# Patient Record
Sex: Male | Born: 1940 | Race: White | Marital: Married | Smoking: Former smoker
Health system: Southern US, Community
[De-identification: ages and names within clinical notes are randomized; demographics above are authoritative.]

## PROBLEM LIST (undated history)

## (undated) DIAGNOSIS — H539 Unspecified visual disturbance: Secondary | ICD-10-CM

## (undated) DIAGNOSIS — E119 Type 2 diabetes mellitus without complications: Secondary | ICD-10-CM

## (undated) DIAGNOSIS — I2510 Atherosclerotic heart disease of native coronary artery without angina pectoris: Secondary | ICD-10-CM

## (undated) DIAGNOSIS — I10 Essential (primary) hypertension: Secondary | ICD-10-CM

## (undated) HISTORY — PX: KNEE ARTHROSCOPY: SUR90

## (undated) HISTORY — DX: Unspecified visual disturbance: H53.9

## (undated) HISTORY — PX: CORONARY ANGIOPLASTY WITH STENT PLACEMENT: SHX49

## (undated) HISTORY — DX: Type 2 diabetes mellitus without complications: E11.9

## (undated) HISTORY — PX: ROTATOR CUFF REPAIR: SHX139

## (undated) HISTORY — DX: Atherosclerotic heart disease of native coronary artery without angina pectoris: I25.10

## (undated) HISTORY — DX: Essential (primary) hypertension: I10

---

## 2011-01-04 ENCOUNTER — Other Ambulatory Visit: Payer: Self-pay | Admitting: Neurosurgery

## 2011-01-04 DIAGNOSIS — M549 Dorsalgia, unspecified: Secondary | ICD-10-CM

## 2011-01-09 ENCOUNTER — Ambulatory Visit
Admission: RE | Admit: 2011-01-09 | Discharge: 2011-01-09 | Disposition: A | Discharge status: Home / Self Care | Payer: Medicare Other | Source: Ambulatory Visit | Attending: Neurosurgery | Admitting: Neurosurgery

## 2011-01-09 ENCOUNTER — Ambulatory Visit
Admission: RE | Admit: 2011-01-09 | Discharge: 2011-01-09 | Disposition: A | Discharge status: Home / Self Care | Payer: Self-pay | Source: Ambulatory Visit | Attending: Neurosurgery | Admitting: Neurosurgery

## 2011-01-09 DIAGNOSIS — M549 Dorsalgia, unspecified: Secondary | ICD-10-CM

## 2014-04-20 ENCOUNTER — Other Ambulatory Visit: Payer: Self-pay | Admitting: Occupational Medicine

## 2014-04-20 ENCOUNTER — Ambulatory Visit: Payer: Self-pay

## 2014-04-20 DIAGNOSIS — R52 Pain, unspecified: Secondary | ICD-10-CM

## 2015-02-03 ENCOUNTER — Ambulatory Visit (INDEPENDENT_AMBULATORY_CARE_PROVIDER_SITE_OTHER): Payer: Medicare PPO | Admitting: Neurology

## 2015-02-03 ENCOUNTER — Encounter: Payer: Self-pay | Admitting: Neurology

## 2015-02-03 VITALS — BP 146/92 | HR 66 | Resp 18 | Ht 78.0 in | Wt 280.6 lb

## 2015-02-03 DIAGNOSIS — M545 Low back pain: Secondary | ICD-10-CM

## 2015-02-03 DIAGNOSIS — M47816 Spondylosis without myelopathy or radiculopathy, lumbar region: Secondary | ICD-10-CM

## 2015-02-03 DIAGNOSIS — M5432 Sciatica, left side: Secondary | ICD-10-CM | POA: Diagnosis not present

## 2015-02-03 DIAGNOSIS — K5900 Constipation, unspecified: Secondary | ICD-10-CM | POA: Insufficient documentation

## 2015-02-03 DIAGNOSIS — F1190 Opioid use, unspecified, uncomplicated: Secondary | ICD-10-CM | POA: Insufficient documentation

## 2015-02-03 DIAGNOSIS — G4700 Insomnia, unspecified: Secondary | ICD-10-CM | POA: Diagnosis not present

## 2015-02-03 DIAGNOSIS — K5909 Other constipation: Secondary | ICD-10-CM | POA: Diagnosis not present

## 2015-02-03 MED ORDER — MORPHINE SULFATE 15 MG PO TABS
15.0000 mg | ORAL_TABLET | ORAL | Status: DC | PRN
Start: 2015-02-03 — End: 2015-02-03

## 2015-02-03 MED ORDER — CYCLOBENZAPRINE HCL 5 MG PO TABS: 5.0000 mg | ORAL_TABLET | Freq: Every day | ORAL | Status: DC

## 2015-02-03 MED ORDER — MORPHINE SULFATE 15 MG PO TABS: 15.0000 mg | ORAL_TABLET | Freq: Two times a day (BID) | ORAL | Status: DC | PRN

## 2015-02-03 MED ORDER — MORPHINE SULFATE ER 30 MG PO TBCR: 30.0000 mg | EXTENDED_RELEASE_TABLET | Freq: Two times a day (BID) | ORAL | Status: DC

## 2015-02-03 NOTE — Progress Notes (Signed)
[facility]  PATIENT: Dale FisherRonald Parks DOB: 10/19/1940 2REFERRING DOCTOR OR PCP:  Josiah LoboJohn McFadden SOURCE: Patient and records from [facility]  _________________________________   HISTORICAL  CHIEF COMPLAINT:  Chief Complaint  Patient presents with  . Back Pain    Sts. sudden onset of nonradiating left sided lbp 2 weeks ago when he bent down to pick up a newspaper.Chucky May./fim    HISTORY OF PRESENT ILLNESS:  Dale Parks is a 74 year old man who [facility]. His main issues are related to his low back pain.     Over the years, due to his pain and him not being a surgical candidate, as been helped by opiate therapy. He got the best benefit from morphine, but had wanted to constipation. Therefore, he switched first 2 of the [place] with less benefit and then to Nucynta. The Nucynta was much better tolerated but has not helped his pain as much.  About 2 weeks ago, he leaned over and began to experience pain in the left back the pain is not going down the leg but does radiate into the buttocks. There is no numbness or weakness associated with the new pain. There has not been any further change in bladder or bowel because of this pain. Unfortunately, the Nucynta has not controlled her pain well enough.  She also has been sleeping poorly and that has been worse since the pain intensified. He has more trouble with sleep maintenance than with sleep onset. In the past, nighttime Flexeril was beneficial.  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 There is facet joint hypertrophy and most of these levels and mild disc protrusions, as well. There is no definite nerve root compression. He underwent 2 medial branch blocks and radiofrequency ablation of the left L2, L3, L4 and L5 medial branch (dorsal ramus) nerves he had a good result with the medial branch blocks  and good results for several weeks with the radiofrequency ablation but then pain returned to the same level as it had been in the past.  REVIEW OF SYSTEMS: Constitutional: No fevers, chills, sweats, or change in appetite Eyes: No visual changes, double vision, eye pain Ear, nose and throat: No hearing loss, ear pain, nasal congestion, sore throat Cardiovascular: No chest pain, palpitations Respiratory: No shortness of breath at rest or with exertion.   No wheezes.  Has snoring GastrointestinaI: No nausea, vomiting, diarrhea, abdominal pain, fecal incontinence Genitourinary: No dysuria, urinary retention or frequency.  No nocturia. Musculoskeletal: Notes right shoulder and lower back pain Integumentary: No rash, pruritus, skin lesions Neurological: as above Psychiatric: No depression at this time.  No anxiety Endocrine: No palpitations, diaphoresis, change in appetite, change in weigh or increased thirst Hematologic/Lymphatic: He Bruises easily.   No anemia, purpura, petechiae. Allergic/Immunologic: No itchy/runny eyes, nasal congestion, recent allergic reactions, rashes  ALLERGIES: Allergies  Allergen Reactions  . Codeine Nausea Only  . Sulfa Antibiotics Rash  . Warfarin Sodium Rash    HOME MEDICATIONS:  Current outpatient prescriptions:  .  allopurinol (ZYLOPRIM) 100 MG tablet, Take 100 mg by mouth daily., Disp: , Rfl:  .  amLODipine (NORVASC) 5 MG tablet, Take 5 mg by mouth daily., Disp: , Rfl:  .  aspirin 81 MG tablet, Take 81 mg by mouth daily., Disp: , Rfl:  .  atorvastatin (LIPITOR) 80 MG tablet, Take 80 mg by mouth daily., Disp: , Rfl:  .  clopidogrel (PLAVIX) 75 MG tablet, Take 75 mg by mouth daily., Disp: , Rfl:  .  DULoxetine (CYMBALTA) 60 MG capsule, Take 60 mg by mouth 2 (two) times daily., Disp: , Rfl:  .  fexofenadine (ALLEGRA) 60 MG tablet, Take 60 mg by mouth 2 (two) times daily., Disp: , Rfl:  .  gabapentin (NEURONTIN) 300 MG capsule, Take 300 mg by mouth 3  (three) times daily as needed., Disp: , Rfl:  .  LORazepam (ATIVAN) 1 MG tablet, Take 1 mg by mouth 2 (two) times daily as needed for anxiety., Disp: , Rfl:  .  metFORMIN (GLUCOPHAGE) 500 MG tablet, Take 500 mg by mouth 4 (four) times daily., Disp: , Rfl:  .  metoprolol succinate (TOPROL-XL) 100 MG 24 hr tablet, Take 100 mg by mouth daily. Take with or immediately following a meal., Disp: , Rfl:  .  nitroGLYCERIN (NITROSTAT) 0.4 MG SL tablet, Place 0.4 mg under the tongue every 5 (five) minutes as needed for chest pain., Disp: , Rfl:  .  Omega-3 Fatty Acids (FISH OIL) 1000 MG CAPS, Take 2,000 mg by mouth daily., Disp: , Rfl:  .  pantoprazole (PROTONIX) 40 MG tablet, Take 40 mg by mouth daily., Disp: , Rfl:  .  ramipril (ALTACE) 10 MG capsule, Take 10 mg by mouth daily., Disp: , Rfl:   PAST MEDICAL HISTORY: Past Medical History  Diagnosis Date  . Coronary artery disease   . Diabetes mellitus without complication   . Hypertension   . Vision abnormalities     PAST SURGICAL HISTORY: Past Surgical History  Procedure Laterality Date  . Coronary angioplasty with stent placement    . Knee arthroscopy Left   . Rotator cuff repair Right     FAMILY HISTORY: Family History  Problem Relation Age of Onset  . Stroke Mother   . COPD Father     SOCIAL HISTORY:  History   Social History  . Marital Status: Married    Spouse Name: N/A  . Number of Children: N/A  . Years of Education: N/A   Occupational History  . Not on file.   Social History Main Topics  . Smoking status: Former Smoker    Quit date: 01/20/2015  . Smokeless tobacco: Not on file  . Alcohol Use: No  . Drug Use: No  . Sexual Activity: Not on file   Other Topics Concern  . Not on file   Social History Narrative  . No narrative on file     PHYSICAL EXAM  Filed Vitals:   02/03/15 1051  BP: 146/92  Pulse: 66  Resp: 18  Height: 6\' 6"  (1.981 m)  Weight: 280 lb 9.6 oz (127.279 kg)    Body mass index is  32.43 kg/(m^2).   General: The patient is well-developed and well-nourished and in no acute distress  Neck: The neck is supple, no carotid bruits are noted.  The neck is nontender.  Cardiovascular: The heart has a regular rate and rhythm with a normal S1 and S2. There were no murmurs, gallops or rubs. Lungs are clear to auscultation.  Skin: Extremities are without significant edema.  Musculoskeletal:  Right shoulder is mildly tender with a reduced range of motion. Back is tender over her lower left lumbar paraspinal muscles on the left piriformis muscle. Trochanteric bursae are not tender.  Neurologic Exam  Mental status: The patient is alert and oriented x 3 at the time of the examination. The patient has apparent normal recent and remote memory,  with an apparently normal attention span and concentration ability.   Speech is normal.  Cranial nerves: Extraocular movements are full.   Facial symmetry is present. There is good facial sensation to soft touch bilaterally.Facial strength is normal.  Trapezius and sternocleidomastoid strength is normal. No dysarthria is noted.  The tongue is midline, and the patient has symmetric elevation of the soft palate. No obvious hearing deficits are noted.  Motor:  Muscle bulk is normal.   Tone is normal. Strength is  5 / 5 in all 4 extremities.   Sensory: Sensory testing is intact to touch in all 4 extremities. He has reduced vibration sensation in his toes..  Coordination: Cerebellar testing reveals good finger-nose-finger and heel-to-shin bilaterally.  Gait and station: Station is normal.   Gait is arthritic. Tandem gait is normal. Romberg is negative.   Reflexes: Deep tendon reflexes are symmetric and normal bilaterally.       DIAGNOSTIC DATA (LABS, IMAGING, TESTING) - I reviewed patient records, labs, notes, testing and imaging myself where available.  Records from [facility] were reviewed.   ASSESSMENT AND PLAN  Facet  syndrome, lumbar  Sciatica of left side  Opiate use  Other constipation  Insomnia    In summary, Mr. Borges is a 74 year old man with a long history of back pain, likely due to facet hypertrophy causing a facet syndrome. He has had some benefit from opiates in the past. Unfortunately, pain management injections have only offered rather temporary benefit and he is not considered a surgical candidate. Pain has recently increased with a superimposed left sciatica. I did trigger point injections of the L L3-L4 and L4-L5 paraspinal muscles on the left and the left piriformis muscle with 80 mg depot Medrol in [place]. He tolerated the procedure well. I will get him back on morphine at that had worked better than Nucynta. He does understand the importance of taking the medicine as prescribed and not mixing with alcohol or with other medications I will also restart his 5 mg Flexeril at bedtime as it has helped his sleep in the past.  He will return to see me in 4 months or sooner if he has new or worsening neurologic symptoms.    Vernetta Dizdarevic A. Epimenio Foot, MD, PhD 02/03/2015, 11:06 AM Certified in Neurology, Clinical Neurophysiology, Sleep Medicine, Pain Medicine and Neuroimaging  [facility] [address] [place], [place] [address] [phone]

## 2015-02-04 ENCOUNTER — Ambulatory Visit: Payer: Self-pay | Admitting: Neurology

## 2015-06-08 ENCOUNTER — Ambulatory Visit (INDEPENDENT_AMBULATORY_CARE_PROVIDER_SITE_OTHER): Payer: Medicare PPO | Admitting: Neurology

## 2015-06-08 ENCOUNTER — Encounter: Payer: Self-pay | Admitting: Neurology

## 2015-06-08 VITALS — BP 120/86 | HR 78 | Resp 18 | Ht 78.0 in | Wt 276.8 lb

## 2015-06-08 DIAGNOSIS — M545 Low back pain: Secondary | ICD-10-CM | POA: Diagnosis not present

## 2015-06-08 DIAGNOSIS — F1190 Opioid use, unspecified, uncomplicated: Secondary | ICD-10-CM | POA: Diagnosis not present

## 2015-06-08 DIAGNOSIS — M47816 Spondylosis without myelopathy or radiculopathy, lumbar region: Secondary | ICD-10-CM

## 2015-06-08 DIAGNOSIS — K5909 Other constipation: Secondary | ICD-10-CM | POA: Diagnosis not present

## 2015-06-08 MED ORDER — MORPHINE SULFATE ER 30 MG PO TBCR: 30.0000 mg | EXTENDED_RELEASE_TABLET | Freq: Two times a day (BID) | ORAL | Status: DC

## 2015-06-08 NOTE — Progress Notes (Signed)
[facility]  PATIENT: Dale Parks DOB: 03-21-1941 2REFERRING DOCTOR OR PCP:  Josiah Lobo SOURCE: Patient and records from [facility]  _________________________________   HISTORICAL  CHIEF COMPLAINT:  Chief Complaint  Patient presents with  . Back Pain    TPI's were given at last ov for non-radiating left sided lbp.  He reports much relief with injections, sts. pain is some worse since last ov, but not quite as before injections.  Sts[facility] and [facility]./fim  . Pain    Pain management contract signed today/fim    HISTORY OF PRESENT ILLNESS:  Dale Parks is a 74 year old man with low back pain.     Due to his pain and him not being a surgical candidate, as been helped by opiate therapy. He got the best benefit from morphine, but tried Nucynta due to constipation.  The Nucynta was much better tolerated but has not helped his pain as much.    He is on lorazepam but only 1 mg in the morning most days.     He is not experiencing as much of the left sciatic pain as he did at the last visit. Most of his pain is axial in the lower back. Pain increases with activity sometimes.  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 There is facet joint hypertrophy and most of these levels and mild disc protrusions, as well. There is no definite nerve root compression. He underwent 2 medial branch blocks and radiofrequency ablation of the left L2, L3, L4 and L5 medial branch (dorsal ramus) nerves he had a good result with the medial branch blocks and good results for several weeks with the radiofrequency ablation but then pain returned to the same level as it had been in the past.  REVIEW OF SYSTEMS: Constitutional: No fevers, chills, sweats, or change in appetite Eyes: No visual changes, double vision, eye pain Ear, nose and throat: No hearing  loss, ear pain, nasal congestion, sore throat Cardiovascular: No chest pain, palpitations Respiratory: No shortness of breath at rest or with exertion.   No wheezes.  Has snoring GastrointestinaI: No nausea, vomiting, diarrhea, abdominal pain, fecal incontinence Genitourinary: No dysuria, urinary retention or frequency.  No nocturia. Musculoskeletal: Notes right shoulder and lower back pain Integumentary: No rash, pruritus, skin lesions Neurological: as above Psychiatric: No depression at this time.  No anxiety Endocrine: No palpitations, diaphoresis, change in appetite, change in weigh or increased thirst Hematologic/Lymphatic: He Bruises easily.   No anemia, purpura, petechiae. Allergic/Immunologic: No itchy/runny eyes, nasal congestion, recent allergic reactions, rashes  ALLERGIES: Allergies  Allergen Reactions  . Codeine Nausea Only  . Sulfa Antibiotics Rash  . Warfarin Sodium Rash    HOME MEDICATIONS:  Current outpatient prescriptions:  .  allopurinol (ZYLOPRIM) 100 MG tablet, Take 100 mg by mouth daily., Disp: , Rfl:  .  amLODipine (NORVASC) 5 MG tablet, Take 5 mg by mouth daily., Disp: , Rfl:  .  aspirin 81 MG tablet, Take 81 mg by mouth daily., Disp: , Rfl:  .  atorvastatin (LIPITOR) 80 MG tablet, Take 80 mg by mouth daily., Disp: , Rfl:  .  clopidogrel (PLAVIX) 75 MG tablet, Take 75 mg by mouth daily., Disp: , Rfl:  .  cyclobenzaprine (FLEXERIL) 5 MG tablet, Take 1 tablet (5 mg total) by mouth at bedtime., Disp: 30 tablet, Rfl: 11 .  DULoxetine (CYMBALTA)  60 MG capsule, Take 60 mg by mouth 2 (two) times daily., Disp: , Rfl:  .  fexofenadine (ALLEGRA) 60 MG tablet, Take 60 mg by mouth 2 (two) times daily., Disp: , Rfl:  .  metFORMIN (GLUCOPHAGE) 500 MG tablet, Take 500 mg by mouth 4 (four) times daily., Disp: , Rfl:  .  metoprolol succinate (TOPROL-XL) 100 MG 24 hr tablet, Take 100 mg by mouth daily. Take with or immediately following a meal., Disp: , Rfl:  .  morphine  (MS CONTIN) 30 MG 12 hr tablet, Take 1 tablet (30 mg total) by mouth every 12 (twelve) hours., Disp: 60 tablet, Rfl: 0 .  morphine (MSIR) 15 MG tablet, Take 1 tablet (15 mg total) by mouth 2 (two) times daily as needed for severe pain., Disp: 60 tablet, Rfl: 0 .  nitroGLYCERIN (NITROSTAT) 0.4 MG SL tablet, Place 0.4 mg under the tongue every 5 (five) minutes as needed for chest pain., Disp: , Rfl:  .  Omega-3 Fatty Acids (FISH OIL) 1000 MG CAPS, Take 2,000 mg by mouth daily., Disp: , Rfl:  .  pantoprazole (PROTONIX) 40 MG tablet, Take 40 mg by mouth daily., Disp: , Rfl:  .  ramipril (ALTACE) 10 MG capsule, Take 10 mg by mouth daily., Disp: , Rfl:  .  gabapentin (NEURONTIN) 300 MG capsule, Take 300 mg by mouth 3 (three) times daily as needed., Disp: , Rfl:  .  LORazepam (ATIVAN) 1 MG tablet, Take 1 mg by mouth 2 (two) times daily as needed for anxiety., Disp: , Rfl:   PAST MEDICAL HISTORY: Past Medical History  Diagnosis Date  . Coronary artery disease   . Diabetes mellitus without complication   . Hypertension   . Vision abnormalities     PAST SURGICAL HISTORY: Past Surgical History  Procedure Laterality Date  . Coronary angioplasty with stent placement    . Knee arthroscopy Left   . Rotator cuff repair Right     FAMILY HISTORY: Family History  Problem Relation Age of Onset  . Stroke Mother   . COPD Father     SOCIAL HISTORY:  Social History   Social History  . Marital Status: Married    Spouse Name: N/A  . Number of Children: N/A  . Years of Education: N/A   Occupational History  . Not on file.   Social History Main Topics  . Smoking status: Former Smoker    Quit date: 01/20/2015  . Smokeless tobacco: Not on file  . Alcohol Use: No  . Drug Use: No  . Sexual Activity: Not on file   Other Topics Concern  . Not on file   Social History Narrative     PHYSICAL EXAM  Filed Vitals:   06/08/15 1100  BP: 120/86  Pulse: 78  Resp: 18  Height: 6\' 6"  (1.981 m)    Weight: 276 lb 12.8 oz (125.556 kg)    Body mass index is 31.99 kg/(m^2).   General: The patient is well-developed and well-nourished and in no acute distress  Musculoskeletal:    Back is tender over her lower left > right lumbar paraspinal muscles   Trochanteric bursae are not tender.  Neurologic Exam  Mental status: The patient is alert and oriented x 3 at the time of the examination. The patient has apparent normal recent and remote memory, with an apparently normal attention span and concentration ability.   Speech is normal.  Cranial nerves: Extraocular movements are full.   Facial strength is normal.  Trapezius and sternocleidomastoid strength is normal.    Motor:  Muscle bulk is normal.   Tone is normal. Strength is  5 / 5 in all 4 extremities.   Sensory: Sensory testing is intact to touch in all 4 extremities. He has reduced vibration sensation in his toes..  Coordination: Cerebellar testing reveals good finger-nose-finger   Gait and station: Station is normal.   Gait is arthritic. Tandem gait is normal.    Reflexes: Deep tendon reflexes are 1+  Bilaterally in legs      DIAGNOSTIC DATA (LABS, IMAGING, TESTING) - I reviewed patient records, labs, notes, testing and imaging myself where available.  [facility] were reviewed.   ASSESSMENT AND PLAN  Facet syndrome, lumbar  Opiate use  Other constipation  1.  We discussed options for pain management. Although his constipation did better on Nucynta, pain did much worse and he would prefer to stay on morphine. He generally has just been taking the MS Contin and has not been using MSIR. Therefore, we will renew the MS Contin twice a day (some days he takes just one). He is advised to never take more than one of his lorazepam's in any day. 2.   He will continue his other medication. 3.  He is advised to stay active and exercises as tolerated. 4.    He will return to see me in 4 months or sooner if  he has new or worsening neurologic symptoms.    Wynn Alldredge A. Epimenio Foot, MD, PhD 06/08/2015, 11:32 AM Certified in Neurology, Clinical Neurophysiology, Sleep Medicine, Pain Medicine and Neuroimaging  [facility] [address] [place], [place] [address] [phone]

## 2015-10-07 ENCOUNTER — Ambulatory Visit (INDEPENDENT_AMBULATORY_CARE_PROVIDER_SITE_OTHER): Payer: Medicare Other | Admitting: Neurology

## 2015-10-07 ENCOUNTER — Encounter: Payer: Self-pay | Admitting: Neurology

## 2015-10-07 VITALS — BP 148/84 | HR 86 | Resp 16 | Ht 78.0 in | Wt 275.6 lb

## 2015-10-07 DIAGNOSIS — K5903 Drug induced constipation: Secondary | ICD-10-CM | POA: Insufficient documentation

## 2015-10-07 DIAGNOSIS — M5432 Sciatica, left side: Secondary | ICD-10-CM

## 2015-10-07 DIAGNOSIS — M545 Low back pain: Secondary | ICD-10-CM | POA: Diagnosis not present

## 2015-10-07 DIAGNOSIS — M47816 Spondylosis without myelopathy or radiculopathy, lumbar region: Secondary | ICD-10-CM

## 2015-10-07 DIAGNOSIS — G4700 Insomnia, unspecified: Secondary | ICD-10-CM | POA: Diagnosis not present

## 2015-10-07 DIAGNOSIS — T402X5A Adverse effect of other opioids, initial encounter: Secondary | ICD-10-CM | POA: Insufficient documentation

## 2015-10-07 MED ORDER — MORPHINE SULFATE 15 MG PO TABS: 15.0000 mg | ORAL_TABLET | Freq: Two times a day (BID) | ORAL | Status: DC | PRN

## 2015-10-07 MED ORDER — MORPHINE SULFATE 15 MG PO TABS: 15.0000 mg | ORAL_TABLET | ORAL | Status: DC | PRN

## 2015-10-07 MED ORDER — MORPHINE SULFATE ER 30 MG PO TBCR: 30.0000 mg | EXTENDED_RELEASE_TABLET | Freq: Two times a day (BID) | ORAL | Status: DC

## 2015-10-07 NOTE — Progress Notes (Signed)
[facility]  PATIENT: Dale Parks DOB: [birth year] 2REFERRING DOCTOR OR PCP:  Josiah Lobo SOURCE: Patient and records from [facility]  _________________________________   HISTORICAL  CHIEF COMPLAINT:  Chief Complaint  Patient presents with  . Back Pain    Sts. has been having more spasms in his lower back, with pain radiating down posterior aspect of left leg to knee.  Would like to discuss pain meds/fim    HISTORY OF PRESENT ILLNESS:  Dale Parks is a 75 year old man with low back pain.     He has pain and spasms in his back.    The spasms usually occur while walking or sitting a while.    Pain is helped by opiates but they cause constipation.   He got the best benefit from morphine, but tried Nucynta (due to constipation).  The Nucynta was much better tolerated but has not helped his pain as much.    He is on lorazepam but only 1 mg in the morning most days - not at night.   Miralax daily has greatly helped his constipation.     Most of his pain is axial in the lower back. Pain increases with activity sometimes.   Left sciatic pain occurs more intermittently -- usually with spasms.    Insomnia is doing good.      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 There is facet joint hypertrophy and most of these levels and mild disc protrusions, as well. There is no definite nerve root compression. He underwent 2 medial branch blocks and radiofrequency ablation of the left L2, L3, L4 and L5 medial branch (dorsal ramus) nerves he had a good result with the medial branch blocks and good results for several weeks with the radiofrequency ablation but then pain returned to the same level as it had been in the past.    TPI's help x 2 weeks, sometimes longer.    REVIEW OF SYSTEMS: Constitutional: No fevers, chills, sweats, or change in appetite Eyes: No visual changes, double vision,  eye pain Ear, nose and throat: No hearing loss, ear pain, nasal congestion, sore throat Cardiovascular: No chest pain, palpitations Respiratory: No shortness of breath at rest or with exertion.   No wheezes.  Has snoring GastrointestinaI: No nausea, vomiting, diarrhea, abdominal pain, fecal incontinence Genitourinary: No dysuria, urinary retention or frequency.  No nocturia. Musculoskeletal: Notes right shoulder and lower back pain Integumentary: No rash, pruritus, skin lesions Neurological: as above Psychiatric: No depression at this time.  No anxiety Endocrine: No palpitations, diaphoresis, change in appetite, change in weigh or increased thirst Hematologic/Lymphatic: He Bruises easily.   No anemia, purpura, petechiae. Allergic/Immunologic: No itchy/runny eyes, nasal congestion, recent allergic reactions, rashes  ALLERGIES: Allergies  Allergen Reactions  . Codeine Nausea Only  . Sulfa Antibiotics Rash  . Warfarin Sodium Rash    HOME MEDICATIONS:  Current outpatient prescriptions:  .  allopurinol (ZYLOPRIM) 100 MG tablet, Take 100 mg by mouth daily., Disp: , Rfl:  .  amLODipine (NORVASC) 5 MG tablet, Take 5 mg by mouth daily., Disp: , Rfl:  .  aspirin 81 MG tablet, Take 81 mg by mouth daily., Disp: , Rfl:  .  atorvastatin (LIPITOR) 80 MG tablet, Take 80 mg by mouth daily., Disp: , Rfl:  .  clopidogrel (PLAVIX) 75 MG tablet, Take 75 mg by mouth daily., Disp: , Rfl:  .  cyclobenzaprine (FLEXERIL) 5 MG tablet, Take  1 tablet (5 mg total) by mouth at bedtime., Disp: 30 tablet, Rfl: 11 .  DULoxetine (CYMBALTA) 60 MG capsule, Take 60 mg by mouth 2 (two) times daily., Disp: , Rfl:  .  fexofenadine (ALLEGRA) 60 MG tablet, Take 60 mg by mouth 2 (two) times daily., Disp: , Rfl:  .  LORazepam (ATIVAN) 1 MG tablet, Take 1 mg by mouth 2 (two) times daily as needed for anxiety., Disp: , Rfl:  .  metFORMIN (GLUCOPHAGE) 500 MG tablet, Take 500 mg by mouth 4 (four) times daily., Disp: , Rfl:  .   metoprolol succinate (TOPROL-XL) 100 MG 24 hr tablet, Take 100 mg by mouth daily. Take with or immediately following a meal., Disp: , Rfl:  .  morphine (MS CONTIN) 30 MG 12 hr tablet, Take 1 tablet (30 mg total) by mouth every 12 (twelve) hours., Disp: 60 tablet, Rfl: 0 .  nitroGLYCERIN (NITROSTAT) 0.4 MG SL tablet, Place 0.4 mg under the tongue every 5 (five) minutes as needed for chest pain., Disp: , Rfl:  .  Omega-3 Fatty Acids (FISH OIL) 1000 MG CAPS, Take 2,000 mg by mouth daily., Disp: , Rfl:  .  pantoprazole (PROTONIX) 40 MG tablet, Take 40 mg by mouth daily., Disp: , Rfl:  .  ramipril (ALTACE) 10 MG capsule, Take 10 mg by mouth daily., Disp: , Rfl:   PAST MEDICAL HISTORY: Past Medical History  Diagnosis Date  . Coronary artery disease   . Diabetes mellitus without complication (HCC)   . Hypertension   . Vision abnormalities     PAST SURGICAL HISTORY: Past Surgical History  Procedure Laterality Date  . Coronary angioplasty with stent placement    . Knee arthroscopy Left   . Rotator cuff repair Right     FAMILY HISTORY: Family History  Problem Relation Age of Onset  . Stroke Mother   . COPD Father     SOCIAL HISTORY:  Social History   Social History  . Marital Status: Married    Spouse Name: N/A  . Number of Children: N/A  . Years of Education: N/A   Occupational History  . Not on file.   Social History Main Topics  . Smoking status: Former Smoker    Quit date: 01/20/2015  . Smokeless tobacco: Not on file  . Alcohol Use: No  . Drug Use: No  . Sexual Activity: Not on file   Other Topics Concern  . Not on file   Social History Narrative     PHYSICAL EXAM  Filed Vitals:   10/07/15 1110  BP: 148/84  Pulse: 86  Resp: 16  Height: 6\' 6"  (1.981 m)  Weight: 275 lb 9.6 oz (125.011 kg)    Body mass index is 31.86 kg/(m^2).   General: The patient is well-developed and well-nourished and in no acute distress  Musculoskeletal:    Back is tender over  her lower left > right lumbar paraspinal muscles and left lower thoracic paraspinal muscles      Neurologic Exam  Mental status: The patient is alert and oriented x 3 at the time of the examination. The patient has apparent normal recent and remote memory, with an apparently normal attention span and concentration ability.   Speech is normal.  Cranial nerves: Extraocular movements are full.   Facial strength is normal.     Motor:  Muscle bulk is normal.   Tone is normal. Strength is  5 / 5 in legs   Sensory: Sensory testing is intact to  touch in all 4 extremities. He has reduced vibration sensation in his toes..  Coordination: Cerebellar testing reveals good finger-nose-finger   Gait and station: Station is normal.   Gait is arthritic. Tandem gait is normal.    Reflexes: Deep tendon reflexes are 1+  Bilaterally in legs      DIAGNOSTIC DATA (LABS, IMAGING, TESTING) - I reviewed patient records, labs, notes, testing and imaging myself where available.  Records from [facility] were reviewed.   ASSESSMENT AND PLAN  Facet syndrome, lumbar  Sciatica of left side  Insomnia  1.  Continue MS Contin 30 mg by mouth twice a day and MSIR 15 mg twice a day as needed. 2.   He will continue his other medication. 3.  He is advised to stay active and exercises as tolerated. 4.  TPI:   Using sterile technique, the left T11-T12, L3, L4 and L5 paraspinal muscles were injected with 80 mg Depo-Medrol in Marcaine. He tolerated the procedure well. 5.     He will return to see me in 4 months or sooner if he has new or worsening neurologic symptoms.    Milburn Freeney A. Epimenio Foot, MD, PhD 10/07/2015, 11:27 AM Certified in Neurology, Clinical Neurophysiology, Sleep Medicine, Pain Medicine and Neuroimaging  [facility] [address] [place], [place] [address] [phone]

## 2015-10-16 DIAGNOSIS — M179 Osteoarthritis of knee, unspecified: Secondary | ICD-10-CM | POA: Insufficient documentation

## 2015-10-16 DIAGNOSIS — M1710 Unilateral primary osteoarthritis, unspecified knee: Secondary | ICD-10-CM | POA: Insufficient documentation

## 2015-11-22 DIAGNOSIS — Z79899 Other long term (current) drug therapy: Secondary | ICD-10-CM | POA: Insufficient documentation

## 2015-11-22 DIAGNOSIS — F419 Anxiety disorder, unspecified: Secondary | ICD-10-CM | POA: Insufficient documentation

## 2015-11-22 DIAGNOSIS — Q828 Other specified congenital malformations of skin: Secondary | ICD-10-CM | POA: Insufficient documentation

## 2015-11-22 DIAGNOSIS — G939 Disorder of brain, unspecified: Secondary | ICD-10-CM | POA: Insufficient documentation

## 2015-11-22 DIAGNOSIS — E669 Obesity, unspecified: Secondary | ICD-10-CM | POA: Insufficient documentation

## 2015-11-22 DIAGNOSIS — M5431 Sciatica, right side: Secondary | ICD-10-CM | POA: Insufficient documentation

## 2015-11-22 DIAGNOSIS — Z7901 Long term (current) use of anticoagulants: Secondary | ICD-10-CM | POA: Insufficient documentation

## 2015-11-22 DIAGNOSIS — E1151 Type 2 diabetes mellitus with diabetic peripheral angiopathy without gangrene: Secondary | ICD-10-CM | POA: Insufficient documentation

## 2015-11-22 DIAGNOSIS — I6782 Cerebral ischemia: Secondary | ICD-10-CM | POA: Insufficient documentation

## 2015-11-22 DIAGNOSIS — M5432 Sciatica, left side: Secondary | ICD-10-CM

## 2015-11-22 DIAGNOSIS — M2240 Chondromalacia patellae, unspecified knee: Secondary | ICD-10-CM | POA: Insufficient documentation

## 2015-11-22 DIAGNOSIS — I739 Peripheral vascular disease, unspecified: Secondary | ICD-10-CM | POA: Insufficient documentation

## 2015-11-22 DIAGNOSIS — J309 Allergic rhinitis, unspecified: Secondary | ICD-10-CM | POA: Insufficient documentation

## 2015-11-22 DIAGNOSIS — M2540 Effusion, unspecified joint: Secondary | ICD-10-CM | POA: Insufficient documentation

## 2015-11-22 DIAGNOSIS — K219 Gastro-esophageal reflux disease without esophagitis: Secondary | ICD-10-CM | POA: Insufficient documentation

## 2015-11-22 DIAGNOSIS — Q667 Congenital pes cavus, unspecified foot: Secondary | ICD-10-CM | POA: Insufficient documentation

## 2015-11-22 DIAGNOSIS — N400 Enlarged prostate without lower urinary tract symptoms: Secondary | ICD-10-CM | POA: Insufficient documentation

## 2015-11-22 DIAGNOSIS — M779 Enthesopathy, unspecified: Secondary | ICD-10-CM | POA: Insufficient documentation

## 2015-11-22 DIAGNOSIS — E785 Hyperlipidemia, unspecified: Secondary | ICD-10-CM | POA: Insufficient documentation

## 2015-11-22 DIAGNOSIS — M5137 Other intervertebral disc degeneration, lumbosacral region: Secondary | ICD-10-CM | POA: Insufficient documentation

## 2015-11-22 DIAGNOSIS — M21629 Bunionette of unspecified foot: Secondary | ICD-10-CM | POA: Insufficient documentation

## 2015-11-22 DIAGNOSIS — I10 Essential (primary) hypertension: Secondary | ICD-10-CM | POA: Insufficient documentation

## 2015-11-22 DIAGNOSIS — I7090 Unspecified atherosclerosis: Secondary | ICD-10-CM | POA: Insufficient documentation

## 2015-11-22 DIAGNOSIS — M4697 Unspecified inflammatory spondylopathy, lumbosacral region: Secondary | ICD-10-CM | POA: Insufficient documentation

## 2015-11-22 DIAGNOSIS — M109 Gout, unspecified: Secondary | ICD-10-CM | POA: Insufficient documentation

## 2016-02-08 ENCOUNTER — Ambulatory Visit (INDEPENDENT_AMBULATORY_CARE_PROVIDER_SITE_OTHER): Payer: Medicare Other | Admitting: Neurology

## 2016-02-08 ENCOUNTER — Encounter: Payer: Self-pay | Admitting: Neurology

## 2016-02-08 VITALS — BP 158/90 | HR 76 | Resp 20 | Ht 78.0 in | Wt 285.5 lb

## 2016-02-08 DIAGNOSIS — F329 Major depressive disorder, single episode, unspecified: Secondary | ICD-10-CM

## 2016-02-08 DIAGNOSIS — M4697 Unspecified inflammatory spondylopathy, lumbosacral region: Secondary | ICD-10-CM | POA: Diagnosis not present

## 2016-02-08 DIAGNOSIS — M5137 Other intervertebral disc degeneration, lumbosacral region: Secondary | ICD-10-CM | POA: Diagnosis not present

## 2016-02-08 DIAGNOSIS — M5430 Sciatica, unspecified side: Secondary | ICD-10-CM | POA: Diagnosis not present

## 2016-02-08 DIAGNOSIS — F1190 Opioid use, unspecified, uncomplicated: Secondary | ICD-10-CM | POA: Diagnosis not present

## 2016-02-08 DIAGNOSIS — M47816 Spondylosis without myelopathy or radiculopathy, lumbar region: Secondary | ICD-10-CM

## 2016-02-08 DIAGNOSIS — T402X5A Adverse effect of other opioids, initial encounter: Secondary | ICD-10-CM

## 2016-02-08 DIAGNOSIS — K5903 Drug induced constipation: Secondary | ICD-10-CM

## 2016-02-08 DIAGNOSIS — M51379 Other intervertebral disc degeneration, lumbosacral region without mention of lumbar back pain or lower extremity pain: Secondary | ICD-10-CM

## 2016-02-08 DIAGNOSIS — M545 Low back pain: Secondary | ICD-10-CM | POA: Diagnosis not present

## 2016-02-08 DIAGNOSIS — F32A Depression, unspecified: Secondary | ICD-10-CM

## 2016-02-08 MED ORDER — MORPHINE SULFATE ER 30 MG PO TBCR: 30.0000 mg | EXTENDED_RELEASE_TABLET | Freq: Two times a day (BID) | ORAL | Status: DC

## 2016-02-08 MED ORDER — MORPHINE SULFATE 15 MG PO TABS: 15.0000 mg | ORAL_TABLET | Freq: Two times a day (BID) | ORAL | Status: DC | PRN

## 2016-02-08 MED ORDER — CYCLOBENZAPRINE HCL 5 MG PO TABS: 5.0000 mg | ORAL_TABLET | Freq: Three times a day (TID) | ORAL | Status: DC

## 2016-02-08 NOTE — Progress Notes (Signed)
[facility]  PATIENT: Dale Parks DOB: 04/07/1941 2REFERRING DOCTOR OR PCP:  Josiah Lobo SOURCE: Patient and records from [facility]  _________________________________   HISTORICAL  CHIEF COMPLAINT:  Chief Complaint  Patient presents with  . Back Pain    Sts. low back and left leg pain is some worse.  Also c/o more spasms in thoracic spine.  His dog recently passed away and he thinks he's been a little depressed.  PCP has offered antidepressent but he declined--doesn't want to start another med right now. Chucky May    HISTORY OF PRESENT ILLNESS:  Dale Parks is a 75 year old man with low back pain and spasms in his back.    The spasms usually occur while walking or sitting a while.    Pain is helped by opiates but they cause constipation.   He got the best benefit from morphine, but tried Nucynta (due to constipation).  The Nucynta was much better tolerated but has not helped his pain as much. 4, he went back on the morphine. His current dose is morphine CR 30 mg twice a day with 15 mg IR twice a day for breakthrough.   He feels the medication helped him more last year and this year.   He is on lorazepam but only 1 mg in the morning most days - not at night.   Miralax daily has greatly helped his constipation.     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 There is facet joint hypertrophy and most of these levels and mild disc protrusions, as well. There is no definite nerve root compression. He underwent 2 medial branch blocks and radiofrequency ablation of the left L2, L3, L4 and L5 medial branch (dorsal ramus) nerves he had a good result with the medial branch blocks and good results for several weeks with the radiofrequency ablation but then pain returned to the same level as it had been in the past.    TPI's help x 2-4 weeks. The last trigger point injections were in January 2017  and he has tolerated them well   Most of his pain is axial in the lower back. Pain increases with activity sometimes.   At times, he will have some pain that goes down the legs but this is much more intermittent than the lower back pain.    Insomnia is doing well.      He notes a mild depression with the recent death of his dog. However, he does not want to make any changes in medications. He is already on Cymbalta.   REVIEW OF SYSTEMS: Constitutional: No fevers, chills, sweats, or change in appetite Eyes: No visual changes, double vision, eye pain Ear, nose and throat: No hearing loss, ear pain, nasal congestion, sore throat Cardiovascular: No chest pain, palpitations Respiratory: No shortness of breath at rest or with exertion.   No wheezes.  Has snoring GastrointestinaI: No nausea, vomiting, diarrhea, abdominal pain, fecal incontinence Genitourinary: No dysuria, urinary retention or frequency.  No nocturia. Musculoskeletal: Notes right shoulder and lower back pain Integumentary: No rash, pruritus, skin lesions Neurological: as above Psychiatric: No depression at this time.  No anxiety Endocrine: No palpitations, diaphoresis, change in appetite, change in weigh or increased thirst Hematologic/Lymphatic: He Bruises easily.   No anemia, purpura, petechiae. Allergic/Immunologic: No itchy/runny eyes, nasal congestion, recent allergic reactions, rashes  ALLERGIES: Allergies  Allergen Reactions  . Codeine Nausea Only  . Sulfa Antibiotics  Rash  . Warfarin Sodium Rash  . Lidocaine Other (See Comments)    unknown    HOME MEDICATIONS:  Current outpatient prescriptions:  .  allopurinol (ZYLOPRIM) 100 MG tablet, Take 100 mg by mouth daily., Disp: , Rfl:  .  amLODipine (NORVASC) 5 MG tablet, Take 5 mg by mouth daily., Disp: , Rfl:  .  aspirin 81 MG tablet, Take 81 mg by mouth daily., Disp: , Rfl:  .  atorvastatin (LIPITOR) 80 MG tablet, Take 80 mg by mouth daily., Disp: , Rfl:  .   clopidogrel (PLAVIX) 75 MG tablet, Take 75 mg by mouth daily., Disp: , Rfl:  .  cyclobenzaprine (FLEXERIL) 5 MG tablet, Take 1 tablet (5 mg total) by mouth 3 (three) times daily., Disp: 90 tablet, Rfl: 11 .  DULoxetine (CYMBALTA) 60 MG capsule, Take 60 mg by mouth 2 (two) times daily., Disp: , Rfl:  .  fexofenadine (ALLEGRA) 60 MG tablet, Take 60 mg by mouth 2 (two) times daily., Disp: , Rfl:  .  LORazepam (ATIVAN) 1 MG tablet, Take 1 mg by mouth 2 (two) times daily as needed for anxiety., Disp: , Rfl:  .  metFORMIN (GLUCOPHAGE) 500 MG tablet, Take 500 mg by mouth 4 (four) times daily., Disp: , Rfl:  .  metoprolol succinate (TOPROL-XL) 100 MG 24 hr tablet, Take 100 mg by mouth daily. Take with or immediately following a meal., Disp: , Rfl:  .  morphine (MS CONTIN) 30 MG 12 hr tablet, Take 1 tablet (30 mg total) by mouth every 12 (twelve) hours., Disp: 60 tablet, Rfl: 0 .  morphine (MSIR) 15 MG tablet, Take 1 tablet (15 mg total) by mouth every 12 (twelve) hours as needed for severe pain., Disp: 60 tablet, Rfl: 0 .  nitroGLYCERIN (NITROSTAT) 0.4 MG SL tablet, Place 0.4 mg under the tongue every 5 (five) minutes as needed for chest pain., Disp: , Rfl:  .  Omega-3 Fatty Acids (FISH OIL) 1000 MG CAPS, Take 2,000 mg by mouth daily., Disp: , Rfl:  .  pantoprazole (PROTONIX) 40 MG tablet, Take 40 mg by mouth daily., Disp: , Rfl:  .  ramipril (ALTACE) 10 MG capsule, Take 10 mg by mouth daily., Disp: , Rfl:   PAST MEDICAL HISTORY: Past Medical History  Diagnosis Date  . Coronary artery disease   . Diabetes mellitus without complication (HCC)   . Hypertension   . Vision abnormalities     PAST SURGICAL HISTORY: Past Surgical History  Procedure Laterality Date  . Coronary angioplasty with stent placement    . Knee arthroscopy Left   . Rotator cuff repair Right     FAMILY HISTORY: Family History  Problem Relation Age of Onset  . Stroke Mother   . COPD Father     SOCIAL HISTORY:  Social  History   Social History  . Marital Status: Married    Spouse Name: N/A  . Number of Children: N/A  . Years of Education: N/A   Occupational History  . Not on file.   Social History Main Topics  . Smoking status: Former Smoker    Quit date: 01/20/2015  . Smokeless tobacco: Not on file  . Alcohol Use: No  . Drug Use: No  . Sexual Activity: Not on file   Other Topics Concern  . Not on file   Social History Narrative     PHYSICAL EXAM  Filed Vitals:   02/08/16 0947  BP: 158/90  Pulse: 76  Resp: 20  Height:  6\' 6"  (1.981 m)  Weight: 285 lb 8 oz (129.502 kg)    Body mass index is 33 kg/(m^2).   General: The patient is well-developed and well-nourished and in no acute distress  Musculoskeletal:    Back is tender over her lower left > right lumbar paraspinal muscles .    Thoracic only mild tender today  Neurologic Exam  Mental status: The patient is alert and oriented x 3 at the time of the examination. The patient has apparent normal recent and remote memory, with an apparently normal attention span and concentration ability.   Speech is normal.  Cranial nerves: Extraocular movements are full.   Facial strength is normal.     Motor:  Muscle bulk is normal.   Tone is normal. Strength is  5 / 5 in legs   Sensory: Sensory testing is intact to touch in all 4 extremities. He has reduced vibration sensation in his toes..  Coordination: Cerebellar testing reveals good finger-nose-finger   Gait and station: Station is normal.   Gait is arthritic. Tandem gait is normal.    Reflexes: Deep tendon reflexes are 1+  Bilaterally in legs      DIAGNOSTIC DATA (LABS, IMAGING, TESTING) - I reviewed patient records, labs, notes, testing and imaging myself where available.  [facility].   ASSESSMENT AND PLAN  Inflammatory spondylopathy of lumbosacral region [facility](HCC)  Neuralgia neuritis, sciatic nerve, unspecified laterality  Constipation  due to opioid therapy  Degeneration of intervertebral disc of lumbosacral region  Facet syndrome, lumbar  Opiate use  Depression    1.  Continue MS Contin 30 mg by mouth twice a day and MSIR 15 mg twice a day as needed.   He has not escalated dosing and has not shown any drug-seeking behavior. 2.   He will increase the Flexeril to tid and continue his other medication. 3.  Stay active and exercises as tolerated. 4.  Using sterile technique, L2, L3, L4 and L5 paraspinal muscles were injected with 80 mg Depo-Medrol in Marcaine. He tolerated the procedure well. 5.    He will return to see me in 4 months or sooner if he has new or worsening neurologic symptoms.    Belmira Daley A. Epimenio FootSater, MD, PhD 02/08/2016, 10:18 AM Certified in Neurology, Clinical Neurophysiology, Sleep Medicine, Pain Medicine and Neuroimaging  [facility] [address], [address] [place], [place] [address] [phone]

## 2016-03-16 IMAGING — CR DG SHOULDER 2+V*R*
3 series · 3 of 3 positions shown · non-contrast
Comparison: None.

CLINICAL DATA: Lifting injury right shoulder 04/08/2014. Pain and
limited range motion.

EXAM:
RIGHT SHOULDER - 2+ VIEW

[view not recorded (1 of 3)]
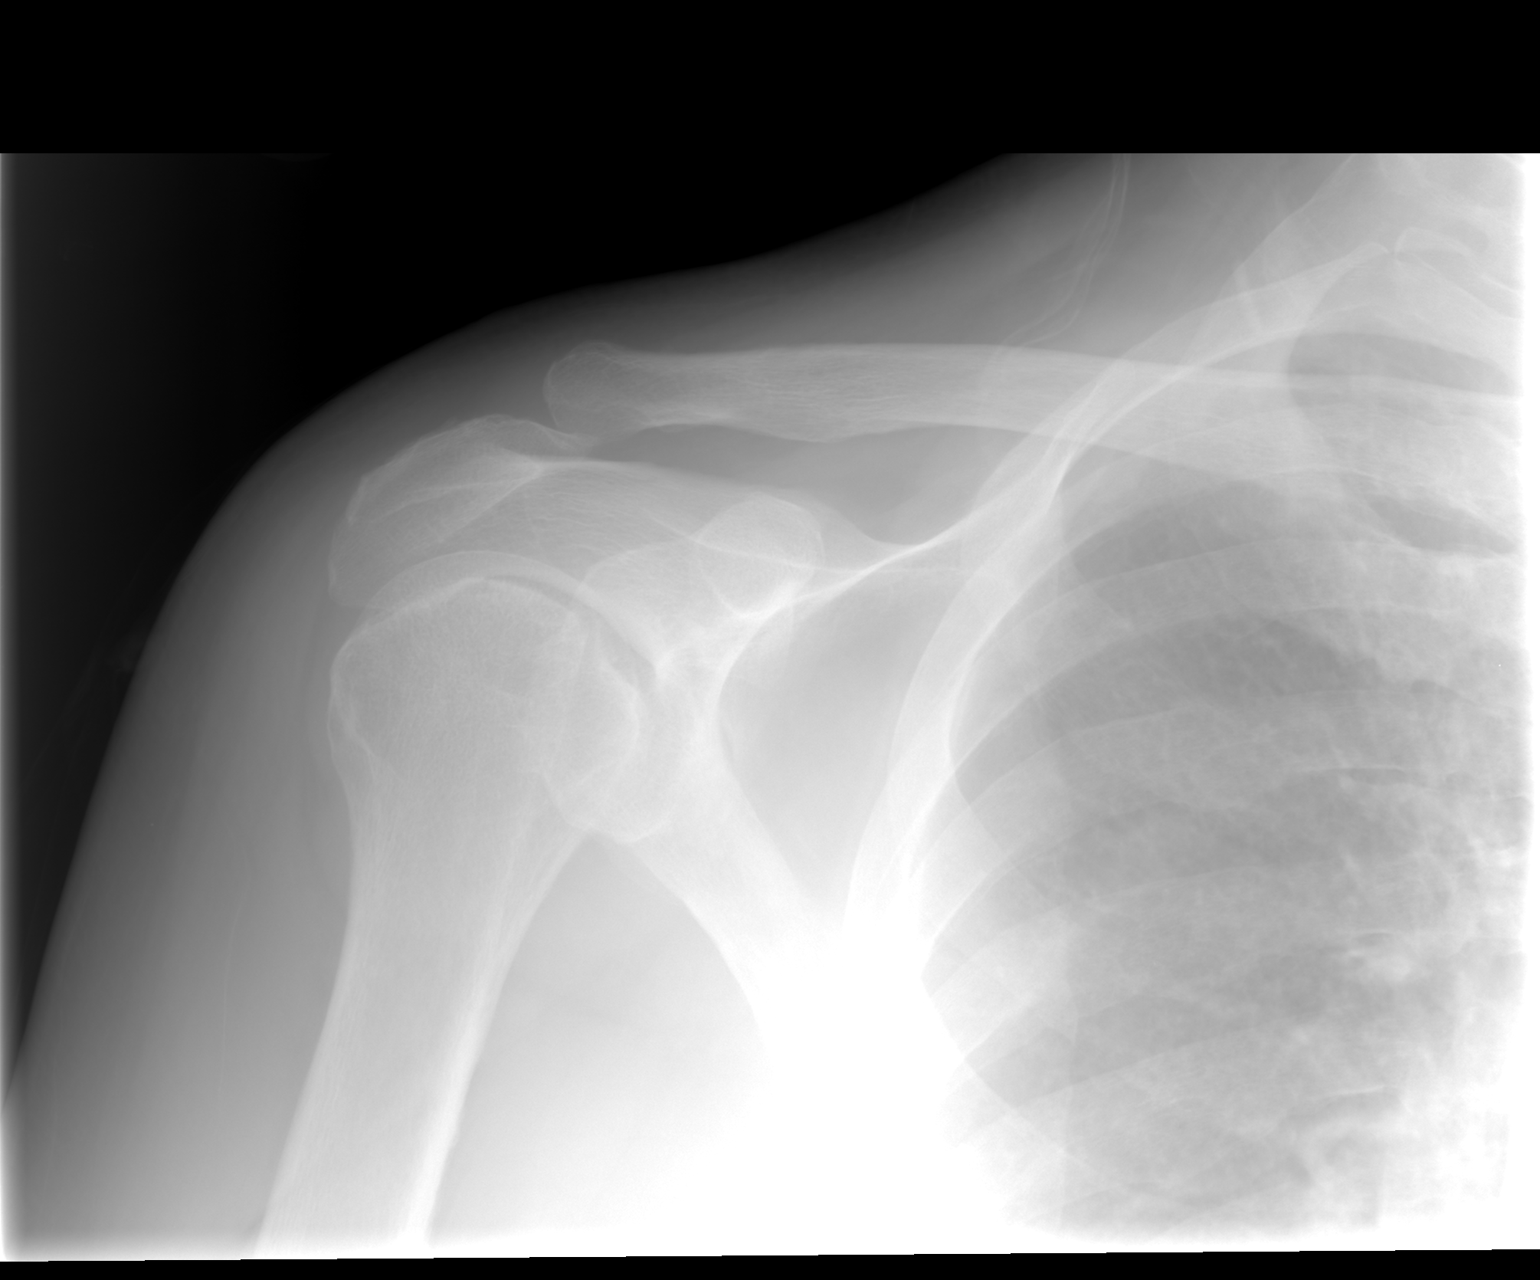

[view not recorded (2 of 3)]
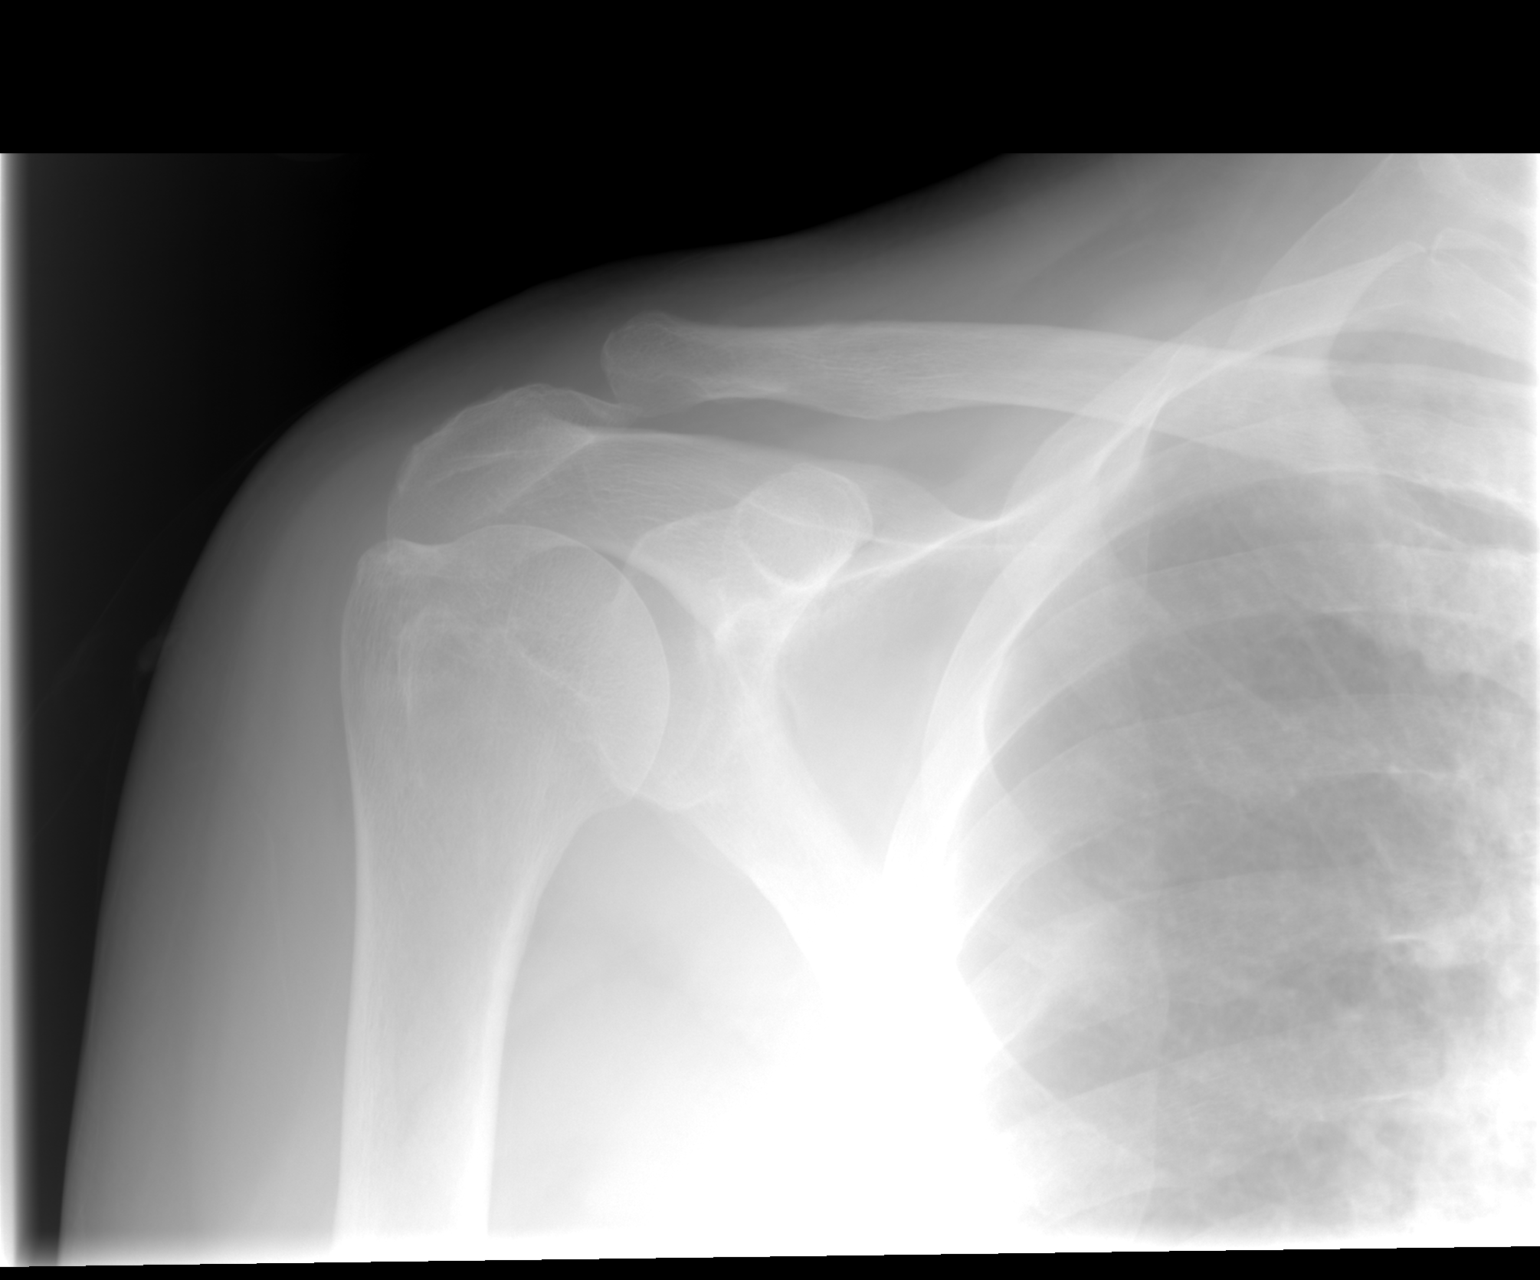

[view not recorded (3 of 3)]
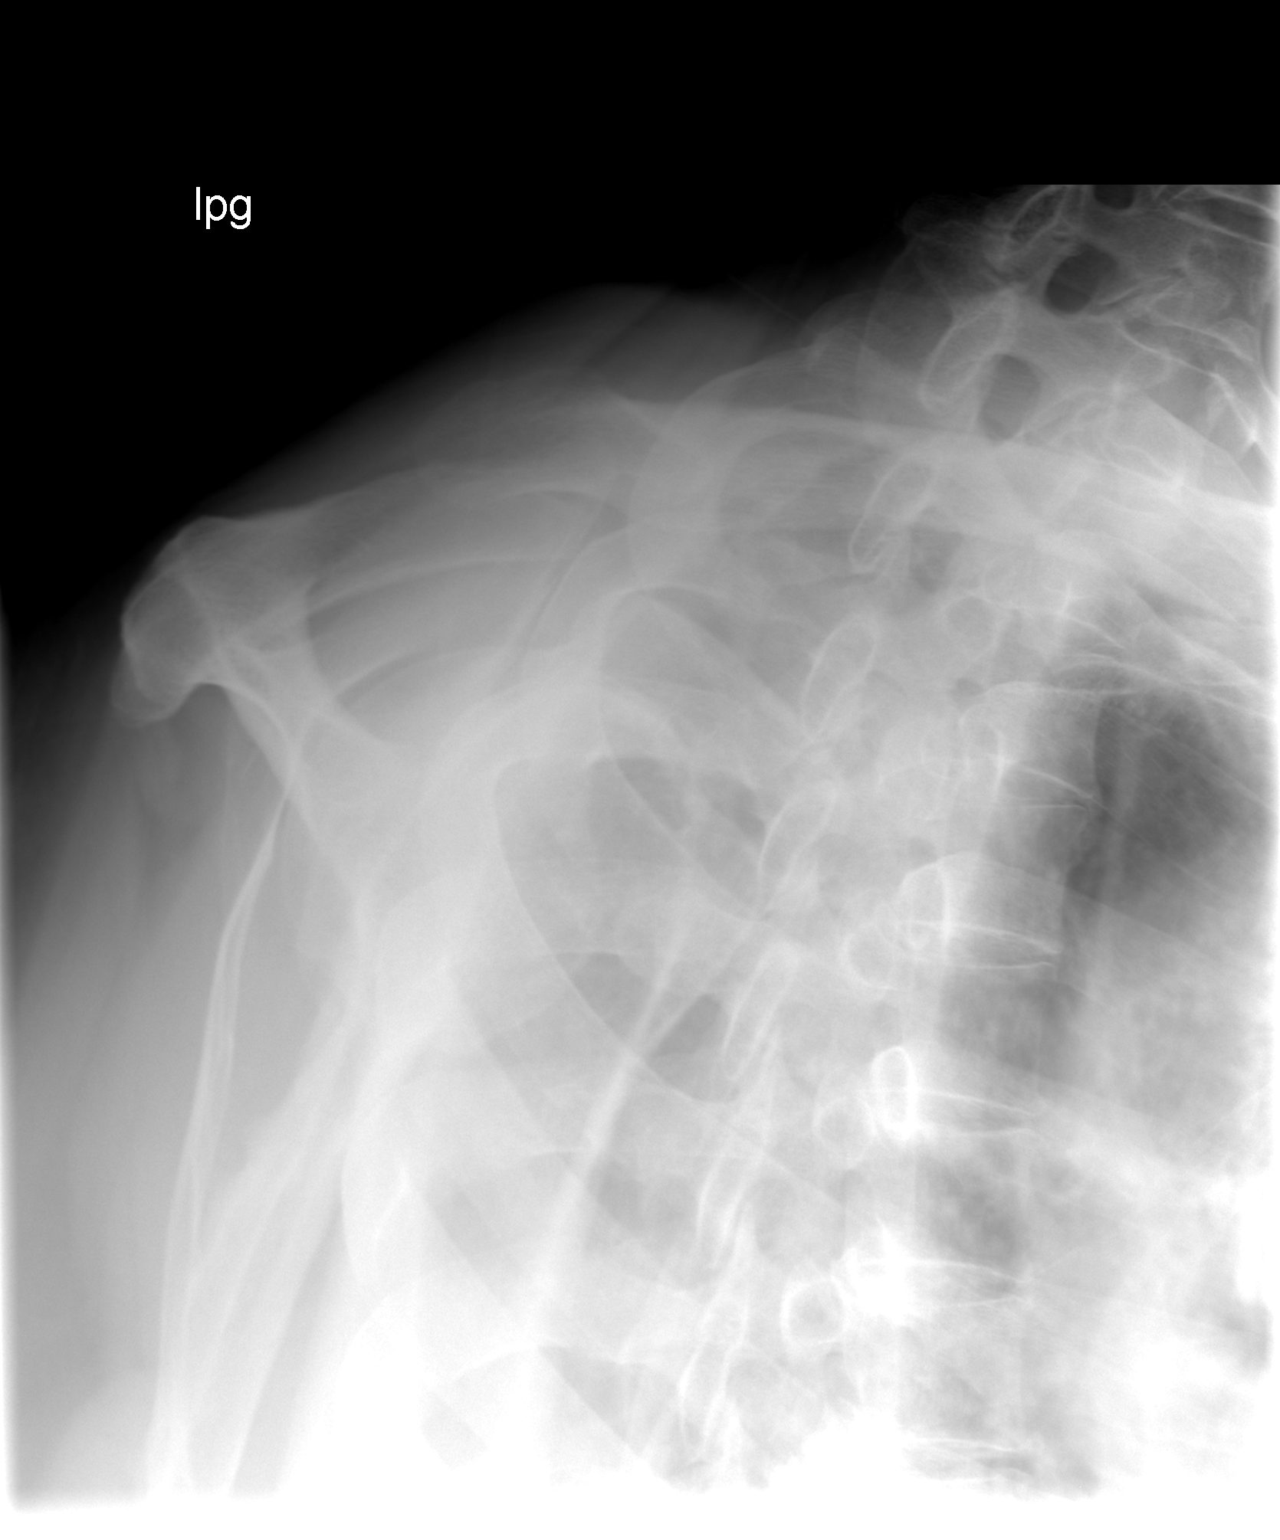

[3 of 3 positions shown; findings below may reference images not displayed]

FINDINGS: Imaged bones, joints and soft tissues appear normal.
IMPRESSION: Negative exam.

## 2016-06-09 ENCOUNTER — Ambulatory Visit: Payer: Medicare Other | Admitting: Neurology

## 2016-06-12 ENCOUNTER — Telehealth: Payer: Self-pay

## 2016-06-12 NOTE — Telephone Encounter (Signed)
I spoke to pt and confirmed his appt tomorrow at 9:20 with Dr. Epimenio FootSater. Pt says he will be at this appt. There have been concerns about the weather, but pt says he will be here.

## 2016-06-13 ENCOUNTER — Ambulatory Visit: Payer: Medicare Other | Admitting: Neurology

## 2016-06-15 ENCOUNTER — Ambulatory Visit (INDEPENDENT_AMBULATORY_CARE_PROVIDER_SITE_OTHER): Payer: Medicare Other | Admitting: Neurology

## 2016-06-15 ENCOUNTER — Encounter: Payer: Self-pay | Admitting: Neurology

## 2016-06-15 VITALS — BP 144/80 | HR 72 | Resp 18 | Ht 78.0 in | Wt 280.0 lb

## 2016-06-15 DIAGNOSIS — T402X5A Adverse effect of other opioids, initial encounter: Secondary | ICD-10-CM

## 2016-06-15 DIAGNOSIS — M47816 Spondylosis without myelopathy or radiculopathy, lumbar region: Secondary | ICD-10-CM

## 2016-06-15 DIAGNOSIS — K5903 Drug induced constipation: Secondary | ICD-10-CM

## 2016-06-15 DIAGNOSIS — M5432 Sciatica, left side: Secondary | ICD-10-CM

## 2016-06-15 DIAGNOSIS — M545 Low back pain: Secondary | ICD-10-CM

## 2016-06-15 DIAGNOSIS — F1190 Opioid use, unspecified, uncomplicated: Secondary | ICD-10-CM | POA: Diagnosis not present

## 2016-06-15 MED ORDER — MORPHINE SULFATE 15 MG PO TABS: 15.0000 mg | ORAL_TABLET | Freq: Two times a day (BID) | ORAL | 0 refills | Status: DC | PRN

## 2016-06-15 MED ORDER — MORPHINE SULFATE ER 30 MG PO TBCR: 30.0000 mg | EXTENDED_RELEASE_TABLET | Freq: Two times a day (BID) | ORAL | 0 refills | Status: DC

## 2016-06-15 NOTE — Progress Notes (Signed)
[facility]  PATIENT: Dale Parks DOB: 04/20/1941 REFERRING DOCTOR OR PCP:  Josiah LoboJohn McFadden SOURCE: Patient and records from [facility]  _________________________________   HISTORICAL  CHIEF COMPLAINT:  Chief Complaint  Patient presents with  . Back Pain    Sts. left sided lbp is some worse.  Occasionally radiates to left leg.  Sts. his wife fell, has a compression fx. so he's having to do more work around the house.  He did not increase Flexeril to tid as instructed at last ov--did not remember receiving these instructions./fim  . Left Arm Pain    Sts. onset of left upper arm pain a few days ago, without  known injury./fim    HISTORY OF PRESENT ILLNESS:  Dale FisherRonald Tellefsen is a 75 year old man with low back pain and spasms in his back.  Pain has increased recently.   He notes doing more around the house since his wife got a compression fracture.   He gets spasms but these are not too bad since starting Flexeril.   The spasms usually occur while walking or sitting a while.    Pain is helped by opiates.   He got the best benefit from morphine.   Nucynta was less beneficial and Fentanyl was associated with lightheadedness and sweating.    His current dose is morphine CR 30 mg twice a day with 15 mg IR twice a day for breakthrough.     He is on lorazepam but only 1 mg in the morning most days - not at night.       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 There is facet joint hypertrophy and most of these levels and mild disc protrusions, as well. There is no definite nerve root compression. He underwent 2 medial branch blocks and radiofrequency ablation of the left L2, L3, L4 and L5 medial branch (dorsal ramus) nerves he had a good result with the medial branch blocks and good results for several weeks with the radiofrequency ablation but then pain returned to the same level as it had been  in the past.    TPI's help x 2-4 weeks. The last trigger point injections were in January 2017 and he has tolerated them well   Most of his pain is axial in the lower back. Pain increases with activity sometimes.   At times, he will have some pain that goes down the legs but this is much more intermittent than the lower back pain.    Insomnia is doing well.      He notes a mild depression with the recent death of his dog. However, he does not want to make any changes in medications. He is already on Cymbalta.   REVIEW OF SYSTEMS: Constitutional: No fevers, chills, sweats, or change in appetite Eyes: No visual changes, double vision, eye pain Ear, nose and throat: No hearing loss, ear pain, nasal congestion, sore throat Cardiovascular: No chest pain, palpitations Respiratory: No shortness of breath at rest or with exertion.   No wheezes.  Has snoring GastrointestinaI: No nausea, vomiting, diarrhea, abdominal pain, fecal incontinence Genitourinary: No dysuria, urinary retention or frequency.  No nocturia. Musculoskeletal: Notes right shoulder and lower back pain Integumentary: No rash, pruritus, skin lesions Neurological: as above Psychiatric: No depression at this time.  No anxiety Endocrine: No palpitations, diaphoresis, change in appetite, change in weigh or increased thirst Hematologic/Lymphatic: He Bruises easily.   No anemia, purpura, petechiae.  Allergic/Immunologic: No itchy/runny eyes, nasal congestion, recent allergic reactions, rashes  ALLERGIES: Allergies  Allergen Reactions  . Codeine Nausea Only  . Sulfa Antibiotics Rash  . Warfarin Sodium Rash  . Lidocaine Other (See Comments)    unknown    HOME MEDICATIONS:  Current Outpatient Prescriptions:  .  allopurinol (ZYLOPRIM) 100 MG tablet, Take 100 mg by mouth daily., Disp: , Rfl:  .  amLODipine (NORVASC) 5 MG tablet, Take 5 mg by mouth daily., Disp: , Rfl:  .  aspirin 81 MG tablet, Take 81 mg by mouth daily., Disp: ,  Rfl:  .  atorvastatin (LIPITOR) 80 MG tablet, Take 80 mg by mouth daily., Disp: , Rfl:  .  clopidogrel (PLAVIX) 75 MG tablet, Take 75 mg by mouth daily., Disp: , Rfl:  .  cyclobenzaprine (FLEXERIL) 5 MG tablet, Take 1 tablet (5 mg total) by mouth 3 (three) times daily., Disp: 90 tablet, Rfl: 11 .  DULoxetine (CYMBALTA) 60 MG capsule, Take 60 mg by mouth 2 (two) times daily., Disp: , Rfl:  .  fexofenadine (ALLEGRA) 60 MG tablet, Take 60 mg by mouth 2 (two) times daily., Disp: , Rfl:  .  LORazepam (ATIVAN) 1 MG tablet, Take 1 mg by mouth 2 (two) times daily as needed for anxiety., Disp: , Rfl:  .  metFORMIN (GLUCOPHAGE) 500 MG tablet, Take 500 mg by mouth 4 (four) times daily., Disp: , Rfl:  .  metoprolol succinate (TOPROL-XL) 100 MG 24 hr tablet, Take 100 mg by mouth daily. Take with or immediately following a meal., Disp: , Rfl:  .  morphine (MS CONTIN) 30 MG 12 hr tablet, Take 1 tablet (30 mg total) by mouth every 12 (twelve) hours., Disp: 60 tablet, Rfl: 0 .  morphine (MSIR) 15 MG tablet, Take 1 tablet (15 mg total) by mouth every 12 (twelve) hours as needed for severe pain., Disp: 60 tablet, Rfl: 0 .  nitroGLYCERIN (NITROSTAT) 0.4 MG SL tablet, Place 0.4 mg under the tongue every 5 (five) minutes as needed for chest pain., Disp: , Rfl:  .  Omega-3 Fatty Acids (FISH OIL) 1000 MG CAPS, Take 2,000 mg by mouth daily., Disp: , Rfl:  .  pantoprazole (PROTONIX) 40 MG tablet, Take 40 mg by mouth daily., Disp: , Rfl:  .  ramipril (ALTACE) 10 MG capsule, Take 10 mg by mouth daily., Disp: , Rfl:   PAST MEDICAL HISTORY: Past Medical History:  Diagnosis Date  . Coronary artery disease   . Diabetes mellitus without complication (HCC)   . Hypertension   . Vision abnormalities     PAST SURGICAL HISTORY: Past Surgical History:  Procedure Laterality Date  . CORONARY ANGIOPLASTY WITH STENT PLACEMENT    . KNEE ARTHROSCOPY Left   . ROTATOR CUFF REPAIR Right     FAMILY HISTORY: Family History    Problem Relation Age of Onset  . Stroke Mother   . COPD Father     SOCIAL HISTORY:  Social History   Social History  . Marital status: Married    Spouse name: N/A  . Number of children: N/A  . Years of education: N/A   Occupational History  . Not on file.   Social History Main Topics  . Smoking status: Former Smoker    Quit date: 01/20/2015  . Smokeless tobacco: Not on file  . Alcohol use No  . Drug use: No  . Sexual activity: Not on file   Other Topics Concern  . Not on file   Social  History Narrative  . No narrative on file     PHYSICAL EXAM  Vitals:   06/15/16 1304  BP: (!) 144/80  Pulse: 72  Resp: 18  Weight: 280 lb (127 kg)  Height: 6\' 6"  (1.981 m)    Body mass index is 32.36 kg/m.   General: The patient is well-developed and well-nourished and in no acute distress  Musculoskeletal:    Back is tender over her lower left > right lumbar paraspinal muscles .       Neurologic Exam  Mental status: The patient is alert and oriented x 3 at the time of the examination. The patient has apparent normal recent and remote memory, with an apparently normal attention span and concentration ability.   Speech is normal.  Cranial nerves: Extraocular movements are full.   Facial strength is normal.     Motor:  Muscle bulk is normal.   Tone is normal. Strength is  5 / 5 in legs   Sensory: Sensory testing is intact to touch in all 4 extremities. He has reduced vibration sensation in his toes..  Coordination: Cerebellar testing reveals good finger-nose-finger   Gait and station: Station is normal.   Gait is arthritic. Tandem gait is normal.    Reflexes: Deep tendon reflexes are 1+  Bilaterally in legs      DIAGNOSTIC DATA (LABS, IMAGING, TESTING) - I reviewed patient records, labs, notes, testing and imaging myself where available.  Records from [facility] were reviewed.   ASSESSMENT AND PLAN  Sciatica of left side  Facet syndrome,  lumbar  Opiate use  Constipation due to opioid therapy   1.  Continue MS Contin 30 mg by mouth twice a day and MSIR 15 mg twice a day as needed.   He has not escalated dosing and has not shown any drug-seeking behavior. 2.   Stay active and exercises as tolerated. 3.   Using sterile technique, L2, L3, L4 and L5 paraspinal muscles were injected with 80 mg Depo-Medrol in [place]. He tolerated the procedure well. 4.    He will return to see me in 4 months or sooner if he has new or worsening neurologic symptoms.    Richard A. Epimenio Foot, MD, PhD 06/15/2016, 6:08 PM Certified in Neurology, Clinical Neurophysiology, Sleep Medicine, Pain Medicine and Neuroimaging  [facility] [address] [place], [place] [address] [phone]  r

## 2016-10-16 ENCOUNTER — Ambulatory Visit (INDEPENDENT_AMBULATORY_CARE_PROVIDER_SITE_OTHER): Payer: Medicare Other | Admitting: Neurology

## 2016-10-16 ENCOUNTER — Encounter: Payer: Self-pay | Admitting: Neurology

## 2016-10-16 VITALS — BP 142/86 | HR 68 | Resp 20 | Ht 78.0 in | Wt 271.5 lb

## 2016-10-16 DIAGNOSIS — F1190 Opioid use, unspecified, uncomplicated: Secondary | ICD-10-CM | POA: Diagnosis not present

## 2016-10-16 DIAGNOSIS — K5903 Drug induced constipation: Secondary | ICD-10-CM

## 2016-10-16 DIAGNOSIS — F329 Major depressive disorder, single episode, unspecified: Secondary | ICD-10-CM | POA: Diagnosis not present

## 2016-10-16 DIAGNOSIS — T402X5A Adverse effect of other opioids, initial encounter: Secondary | ICD-10-CM

## 2016-10-16 DIAGNOSIS — F32A Depression, unspecified: Secondary | ICD-10-CM

## 2016-10-16 DIAGNOSIS — M5432 Sciatica, left side: Secondary | ICD-10-CM

## 2016-10-16 DIAGNOSIS — M47816 Spondylosis without myelopathy or radiculopathy, lumbar region: Secondary | ICD-10-CM

## 2016-10-16 DIAGNOSIS — M1288 Other specific arthropathies, not elsewhere classified, other specified site: Secondary | ICD-10-CM | POA: Diagnosis not present

## 2016-10-16 MED ORDER — MORPHINE SULFATE ER 30 MG PO TBCR: 30.0000 mg | EXTENDED_RELEASE_TABLET | Freq: Two times a day (BID) | ORAL | 0 refills | Status: DC

## 2016-10-16 MED ORDER — MORPHINE SULFATE 15 MG PO TABS: 15.0000 mg | ORAL_TABLET | Freq: Two times a day (BID) | ORAL | 0 refills | Status: DC | PRN

## 2016-10-16 NOTE — Progress Notes (Signed)
[facility]  PATIENT: Dale Parks DOB: 09/11/1941 REFERRING DOCTOR OR PCP:  Josiah Lobo SOURCE: Patient and records from [facility]  _________________________________   HISTORICAL  CHIEF COMPLAINT:  Chief Complaint  Patient presents with  . Back Pain    Sts. lbp is worse, occasionally goes down left leg./fim    HISTORY OF PRESENT ILLNESS:  Dale Parks is a 76 year old man with low back pain and spasms in his back.  Pain was worse the past few weeks with the colder temperature.s   He has had more hip pain, mostly on the right.   Most of his pain is axial in the lower back. However, at times there is some radiating pain into the legs, left > right.  Pain increases with activity sometimes.     He has lost 7-8 poiunds and hoped to lose   He gets LBP spasms, helped by Flexeril.    Pain is helped by opiates.   He got the best benefit from morphine.   Nucynta was less beneficial and Fentanyl was associated with lightheadedness and sweating.    His current dose is morphine CR 30 mg twice a day with 15 mg IR twice a day for breakthrough.     He is on lorazepam but only 1 mg in the morning most days - not at night.        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 There is facet joint hypertrophy and most of these levels and mild disc protrusions, as well. There is no definite nerve root compression. He underwent 2 medial branch blocks and radiofrequency ablation of the left L2, L3, L4 and L5 medial branch (dorsal ramus) nerves he had a good result with the medial branch blocks and good results for several weeks with the radiofrequency ablation but then pain returned to the same level as it had been in the past.    TPI's help x 2-4 weeks. 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   He notes less depression (had more  last year he feels).   He gets frustrated being abl to do less.   He is already on Cymbalta.   REVIEW OF SYSTEMS: Constitutional: No fevers, chills, sweats, or change in appetite Eyes: No visual changes, double vision, eye pain Ear, nose and throat: No hearing loss, ear pain, nasal congestion, sore throat Cardiovascular: No chest pain, palpitations Respiratory: No shortness of breath at rest or with exertion.   No wheezes.  Has snoring GastrointestinaI: No nausea, vomiting, diarrhea, abdominal pain, fecal incontinence Genitourinary: No dysuria, urinary retention or frequency.  No nocturia. Musculoskeletal: Notes right shoulder and lower back pain Integumentary: No rash, pruritus, skin lesions Neurological: as above Psychiatric: No depression at this time.  No anxiety Endocrine: No palpitations, diaphoresis, change in appetite, change in weigh or increased thirst Hematologic/Lymphatic: He Bruises easily.   No anemia, purpura, petechiae. Allergic/Immunologic: No itchy/runny eyes, nasal congestion, recent allergic reactions, rashes  ALLERGIES: Allergies  Allergen Reactions  . Codeine Nausea Only  . Sulfa Antibiotics Rash  . Warfarin Sodium Rash  . Lidocaine Other (See Comments)    unknown    HOME MEDICATIONS:  Current Outpatient Prescriptions:  .  allopurinol (ZYLOPRIM) 100 MG tablet, Take 100 mg by mouth daily., Disp: , Rfl:  .  amLODipine (NORVASC) 5 MG tablet, Take 5 mg by  mouth daily., Disp: , Rfl:  .  aspirin 81 MG tablet, Take 81 mg by mouth daily., Disp: , Rfl:  .  atorvastatin (LIPITOR) 80 MG tablet, Take 80 mg by mouth daily., Disp: , Rfl:  .  cyclobenzaprine (FLEXERIL) 5 MG tablet, Take 1 tablet (5 mg total) by mouth 3 (three) times daily., Disp: 90 tablet, Rfl: 11 .  DULoxetine (CYMBALTA) 60 MG capsule, Take 60 mg by mouth 2 (two) times daily., Disp: , Rfl:  .  fexofenadine (ALLEGRA) 60 MG tablet, Take 60 mg by mouth 2 (two) times daily., Disp: , Rfl:  .  LORazepam  (ATIVAN) 1 MG tablet, Take 1 mg by mouth 2 (two) times daily as needed for anxiety., Disp: , Rfl:  .  metFORMIN (GLUCOPHAGE) 500 MG tablet, Take 500 mg by mouth 4 (four) times daily., Disp: , Rfl:  .  metoprolol succinate (TOPROL-XL) 100 MG 24 hr tablet, Take 100 mg by mouth daily. Take with or immediately following a meal., Disp: , Rfl:  .  morphine (MS CONTIN) 30 MG 12 hr tablet, Take 1 tablet (30 mg total) by mouth every 12 (twelve) hours., Disp: 60 tablet, Rfl: 0 .  morphine (MSIR) 15 MG tablet, Take 1 tablet (15 mg total) by mouth every 12 (twelve) hours as needed for severe pain., Disp: 60 tablet, Rfl: 0 .  Omega-3 Fatty Acids (FISH OIL) 1000 MG CAPS, Take 2,000 mg by mouth daily., Disp: , Rfl:  .  pantoprazole (PROTONIX) 40 MG tablet, Take 40 mg by mouth daily., Disp: , Rfl:  .  ramipril (ALTACE) 10 MG capsule, Take 10 mg by mouth daily., Disp: , Rfl:  .  clopidogrel (PLAVIX) 75 MG tablet, Take 75 mg by mouth daily., Disp: , Rfl:  .  nitroGLYCERIN (NITROSTAT) 0.4 MG SL tablet, Place 0.4 mg under the tongue every 5 (five) minutes as needed for chest pain., Disp: , Rfl:   PAST MEDICAL HISTORY: Past Medical History:  Diagnosis Date  . Coronary artery disease   . Diabetes mellitus without complication (HCC)   . Hypertension   . Vision abnormalities     PAST SURGICAL HISTORY: Past Surgical History:  Procedure Laterality Date  . CORONARY ANGIOPLASTY WITH STENT PLACEMENT    . KNEE ARTHROSCOPY Left   . ROTATOR CUFF REPAIR Right     FAMILY HISTORY: Family History  Problem Relation Age of Onset  . Stroke Mother   . COPD Father     SOCIAL HISTORY:  Social History   Social History  . Marital status: Married    Spouse name: N/A  . Number of children: N/A  . Years of education: N/A   Occupational History  . Not on file.   Social History Main Topics  . Smoking status: Former Smoker    Quit date: 01/20/2015  . Smokeless tobacco: Not on file  . Alcohol use No  . Drug use: No   . Sexual activity: Not on file   Other Topics Concern  . Not on file   Social History Narrative  . No narrative on file     PHYSICAL EXAM  Vitals:   10/16/16 1035  BP: (!) 142/86  Pulse: 68  Resp: 20  Weight: 271 lb 8 oz (123.2 kg)  Height: 6\' 6"  (1.981 m)    Body mass index is 31.37 kg/m.   General: The patient is well-developed and well-nourished and in no acute distress  Musculoskeletal:    Back is tender over her lower left >  right lumbar paraspinal muscles .       Neurologic Exam  Mental status: The patient is alert and oriented x 3 at the time of the examination. The patient has apparent normal recent and remote memory, with an apparently normal attention span and concentration ability.   Speech is normal.  Cranial nerves: Extraocular movements are full.   Facial strength is normal.     Motor:  Muscle bulk is normal.   Tone is normal. Strength is  5 / 5 in legs   Sensory: Sensory testing is intact to touch in all 4 extremities. He has reduced vibration sensation in his toes..  Coordination: Cerebellar testing reveals good finger-nose-finger   Gait and station: Station is normal.   Gait is arthritic. Tandem gait is normal.    Reflexes: Deep tendon reflexes are 1+  Bilaterally in legs      DIAGNOSTIC DATA (LABS, IMAGING, TESTING) - I reviewed patient records, labs, notes, testing and imaging myself where available.  [facility].   ASSESSMENT AND PLAN  Facet syndrome, lumbar  Opiate use  Constipation due to opioid therapy  Depression, unspecified depression type  Sciatica of left side   1.  Continue MS Contin 30 mg by mouth twice a day and MSIR 15 mg twice a day as needed.     2.   Advised to  stay active and exercises as tolerated.  Try to lose more weight 3.   Using sterile technique, L2, L3, L4 and L5 paraspinal muscles were injected with 80 mg Depo-Medrol in Marc[place]. He tolerated the procedure well. 4.     He will return to see me in 4 months or sooner if he has new or worsening neurologic symptoms.    Sigurd Pugh A. Epimenio FootSater, MD, PhD 10/16/2016, 3:04 PM Certified in Neurology, Clinical Neurophysiology, Sleep Medicine, Pain Medicine and Neuroimaging  [facility] [address], [address] [place], [place] [address] [phone]

## 2017-02-15 ENCOUNTER — Encounter: Payer: Self-pay | Admitting: Neurology

## 2017-02-15 ENCOUNTER — Encounter (INDEPENDENT_AMBULATORY_CARE_PROVIDER_SITE_OTHER): Payer: Self-pay

## 2017-02-15 ENCOUNTER — Ambulatory Visit (INDEPENDENT_AMBULATORY_CARE_PROVIDER_SITE_OTHER): Payer: Medicare Other | Admitting: Neurology

## 2017-02-15 VITALS — BP 131/82 | HR 74 | Resp 20 | Ht 78.0 in | Wt 271.5 lb

## 2017-02-15 DIAGNOSIS — M5431 Sciatica, right side: Secondary | ICD-10-CM

## 2017-02-15 DIAGNOSIS — M5432 Sciatica, left side: Secondary | ICD-10-CM | POA: Diagnosis not present

## 2017-02-15 DIAGNOSIS — M47816 Spondylosis without myelopathy or radiculopathy, lumbar region: Secondary | ICD-10-CM

## 2017-02-15 DIAGNOSIS — M5137 Other intervertebral disc degeneration, lumbosacral region: Secondary | ICD-10-CM

## 2017-02-15 DIAGNOSIS — M4696 Unspecified inflammatory spondylopathy, lumbar region: Secondary | ICD-10-CM | POA: Diagnosis not present

## 2017-02-15 DIAGNOSIS — M51379 Other intervertebral disc degeneration, lumbosacral region without mention of lumbar back pain or lower extremity pain: Secondary | ICD-10-CM

## 2017-02-15 MED ORDER — MORPHINE SULFATE ER 30 MG PO TBCR: 30.0000 mg | EXTENDED_RELEASE_TABLET | Freq: Two times a day (BID) | ORAL | 0 refills | Status: DC

## 2017-02-15 MED ORDER — MORPHINE SULFATE 15 MG PO TABS: 15.0000 mg | ORAL_TABLET | Freq: Two times a day (BID) | ORAL | 0 refills | Status: DC | PRN

## 2017-02-15 NOTE — Progress Notes (Signed)
[facility]  PATIENT: Dale Parks DOB: 10/27/1940 REFERRING DOCTOR OR PCP:  Josiah Lobo SOURCE: Patient and records from [facility]  _________________________________   HISTORICAL  CHIEF COMPLAINT:  Chief Complaint  Patient presents with  . Back Pain    Continues to have his normal low back, left sided sciatica pain. Today he c/o new pain--mid back, "catching" pain onset 2-3 mos. ago without known injury/fim    HISTORY OF PRESENT ILLNESS:  Dale Parks is a 77 year old man with low back pain and spasms in his back.    He reports the lower back pain is increased.  Pain is mostly in the low lumbar spine and in the piriformis/SI region.   At times, pain radiates into the left leg.   He denies new weakness or numbness.   No change in bladder.   Pain is worse as he stands up.   Sitting reduces pain some.       He also notes spasms and takes cyclobenzaprine once or twice a day (mostly evening and night). The pain is helped by opiates.   He got the best benefit from morphine.   Nucynta was less beneficial and Fentanyl was associated with lightheadedness and sweating.    His current dose is morphine CR 30 mg twice a day with 15 mg IR twice a day for breakthrough.     He is on lorazepam but only 1 mg in the morning most days - not at night.      Hip pain is doing better since the bursa injection. Neck and shoulders are not hurting him much.    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 There is facet joint hypertrophy and most of these levels and mild disc protrusions, as well. There is no definite nerve root compression. He underwent 2 medial branch blocks and radiofrequency ablation of the left L2, L3, L4 and L5 medial branch (dorsal ramus) nerves he had a good result with the medial branch blocks and good results for several weeks with the radiofrequency ablation but then pain  returned to the same level as it had been in the past.    TPI's help x 2-4 weeks. 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   He notes less depression (had more last year he feels).   He gets frustrated being abl to do less.   He is already on Cymbalta.   REVIEW OF SYSTEMS: Constitutional: No fevers, chills, sweats, or change in appetite Eyes: No visual changes, double vision, eye pain Ear, nose and throat: No hearing loss, ear pain, nasal congestion, sore throat Cardiovascular: No chest pain, palpitations Respiratory: No shortness of breath at rest or with exertion.   No wheezes.  Has snoring GastrointestinaI: No nausea, vomiting, diarrhea, abdominal pain, fecal incontinence Genitourinary: No dysuria, urinary retention or frequency.  No nocturia. Musculoskeletal: Notes right shoulder and lower back pain Integumentary: No rash, pruritus, skin lesions Neurological: as above Psychiatric: No depression at this time.  No anxiety Endocrine: No palpitations, diaphoresis, change in appetite, change in weigh or increased thirst Hematologic/Lymphatic: He Bruises easily.   No anemia, purpura, petechiae. Allergic/Immunologic: No itchy/runny eyes, nasal congestion, recent allergic reactions, rashes  ALLERGIES: Allergies  Allergen Reactions  . Codeine Nausea Only  . Sulfa Antibiotics Rash  . Warfarin Sodium Rash  . Lidocaine Other (See Comments)  unknown    HOME MEDICATIONS:  Current Outpatient Prescriptions:  .  allopurinol (ZYLOPRIM) 100 MG tablet, Take 100 mg by mouth daily., Disp: , Rfl:  .  amLODipine (NORVASC) 5 MG tablet, Take 5 mg by mouth daily., Disp: , Rfl:  .  aspirin 81 MG tablet, Take 81 mg by mouth daily., Disp: , Rfl:  .  atorvastatin (LIPITOR) 80 MG tablet, Take 80 mg by mouth daily., Disp: , Rfl:  .  clopidogrel (PLAVIX) 75 MG tablet, Take 75 mg by mouth daily., Disp: , Rfl:  .  cyclobenzaprine  (FLEXERIL) 5 MG tablet, Take 1 tablet (5 mg total) by mouth 3 (three) times daily., Disp: 90 tablet, Rfl: 11 .  DULoxetine (CYMBALTA) 60 MG capsule, Take 60 mg by mouth 2 (two) times daily., Disp: , Rfl:  .  fexofenadine (ALLEGRA) 60 MG tablet, Take 60 mg by mouth 2 (two) times daily., Disp: , Rfl:  .  LORazepam (ATIVAN) 1 MG tablet, Take 1 mg by mouth 2 (two) times daily as needed for anxiety., Disp: , Rfl:  .  metFORMIN (GLUCOPHAGE) 500 MG tablet, Take 500 mg by mouth 4 (four) times daily., Disp: , Rfl:  .  metoprolol succinate (TOPROL-XL) 100 MG 24 hr tablet, Take 100 mg by mouth daily. Take with or immediately following a meal., Disp: , Rfl:  .  morphine (MS CONTIN) 30 MG 12 hr tablet, Take 1 tablet (30 mg total) by mouth every 12 (twelve) hours., Disp: 60 tablet, Rfl: 0 .  morphine (MSIR) 15 MG tablet, Take 1 tablet (15 mg total) by mouth every 12 (twelve) hours as needed for severe pain., Disp: 60 tablet, Rfl: 0 .  nitroGLYCERIN (NITROSTAT) 0.4 MG SL tablet, Place 0.4 mg under the tongue every 5 (five) minutes as needed for chest pain., Disp: , Rfl:  .  Omega-3 Fatty Acids (FISH OIL) 1000 MG CAPS, Take 2,000 mg by mouth daily., Disp: , Rfl:  .  pantoprazole (PROTONIX) 40 MG tablet, Take 40 mg by mouth daily., Disp: , Rfl:  .  ramipril (ALTACE) 10 MG capsule, Take 10 mg by mouth daily., Disp: , Rfl:   PAST MEDICAL HISTORY: Past Medical History:  Diagnosis Date  . Coronary artery disease   . Diabetes mellitus without complication (HCC)   . Hypertension   . Vision abnormalities     PAST SURGICAL HISTORY: Past Surgical History:  Procedure Laterality Date  . CORONARY ANGIOPLASTY WITH STENT PLACEMENT    . KNEE ARTHROSCOPY Left   . ROTATOR CUFF REPAIR Right     FAMILY HISTORY: Family History  Problem Relation Age of Onset  . Stroke Mother   . COPD Father     SOCIAL HISTORY:  Social History   Social History  . Marital status: Married    Spouse name: N/A  . Number of  children: N/A  . Years of education: N/A   Occupational History  . Not on file.   Social History Main Topics  . Smoking status: Former Smoker    Quit date: 01/20/2015  . Smokeless tobacco: Never Used  . Alcohol use No  . Drug use: No  . Sexual activity: Not on file   Other Topics Concern  . Not on file   Social History Narrative  . No narrative on file     PHYSICAL EXAM  Vitals:   02/15/17 1106  BP: 131/82  Pulse: 74  Resp: 20  Weight: 271 lb 8 oz (123.2 kg)  Height: 6\' 6"  (1.981  m)    Body mass index is 31.37 kg/m.   General: The patient is well-developed and well-nourished and in no acute distress  Musculoskeletal:    His back is tender in the lower lumbar paraspinal muscles and also the piriformis muscles. The trochanteric bursae are not tender..       Neurologic Exam  Mental status: The patient is alert and oriented x 3 at the time of the examination. The patient has apparent normal recent and remote memory, with an apparently normal attention span and concentration ability.   Speech is normal.  Cranial nerves: Extraocular movements are full.   Facial strength is normal.     Motor:  Muscle bulk is normal.   Tone is normal. Strength is  5 / 5 in legs   Sensory: Sensory testing is intact to touch in all 4 extremities. He has reduced vibration sensation in his toes..   Gait and station: Station is normal.   Gait is arthritic. Tandem gait is normal.    Reflexes: Deep tendon reflexes are 1+  Bilaterally in legs      DIAGNOSTIC DATA (LABS, IMAGING, TESTING) - I reviewed patient records, labs, notes, testing and imaging myself where available.  Records from [facility] were reviewed.   ASSESSMENT AND PLAN  Bilateral sciatica  Facet syndrome, lumbar (HCC)  Degeneration of intervertebral disc of lumbosacral region   1.  Continue MS Contin 30 mg by mouth twice a day and MSIR 15 mg twice a day as needed.     2.   Using sterile technique,  piriformis muscles and L4-L5 paraspinal muscles were injected with 80 mg Depo-Medrol in Marcaine. He tolerated the procedure well. 3.   Continue to be a active and exercises tolerated. 4.   The pain does not improve, consider referral for radiofrequency ablation.   He will return to see me in 4 months or sooner if he has new or worsening neurologic symptoms.    Danae Oland A. Epimenio Foot, MD, PhD 02/15/2017, 11:25 AM Certified in Neurology, Clinical Neurophysiology, Sleep Medicine, Pain Medicine and Neuroimaging  [facility] [address], [address] [place], [place] [address] [phone]

## 2017-04-04 ENCOUNTER — Other Ambulatory Visit: Payer: Self-pay | Admitting: Neurology

## 2017-04-09 ENCOUNTER — Telehealth: Payer: Self-pay | Admitting: Neurology

## 2017-04-09 MED ORDER — CYCLOBENZAPRINE HCL 5 MG PO TABS: 5.0000 mg | ORAL_TABLET | Freq: Three times a day (TID) | ORAL | 11 refills | Status: DC

## 2017-04-09 NOTE — Telephone Encounter (Signed)
LMOM (identified vm) that Flexeril was escribed to [facility] on 04/05/17.  I will send it again now/fim

## 2017-04-09 NOTE — Telephone Encounter (Signed)
Patient called and requested an update on his refill for rx flexeril. He states that he called in and requested the refill be sent to [facility] last week and it hasnt been done yet. Please call and advise.

## 2017-06-19 ENCOUNTER — Ambulatory Visit: Payer: Medicare Other | Admitting: Neurology

## 2017-07-11 ENCOUNTER — Encounter (INDEPENDENT_AMBULATORY_CARE_PROVIDER_SITE_OTHER): Payer: Self-pay

## 2017-07-11 ENCOUNTER — Ambulatory Visit (INDEPENDENT_AMBULATORY_CARE_PROVIDER_SITE_OTHER): Payer: Medicare Other | Admitting: Neurology

## 2017-07-11 ENCOUNTER — Encounter: Payer: Self-pay | Admitting: Neurology

## 2017-07-11 VITALS — BP 140/82 | HR 76 | Resp 14 | Ht 78.0 in | Wt 271.5 lb

## 2017-07-11 DIAGNOSIS — T402X5A Adverse effect of other opioids, initial encounter: Secondary | ICD-10-CM

## 2017-07-11 DIAGNOSIS — F1190 Opioid use, unspecified, uncomplicated: Secondary | ICD-10-CM

## 2017-07-11 DIAGNOSIS — M5432 Sciatica, left side: Secondary | ICD-10-CM

## 2017-07-11 DIAGNOSIS — M47816 Spondylosis without myelopathy or radiculopathy, lumbar region: Secondary | ICD-10-CM

## 2017-07-11 DIAGNOSIS — K5903 Drug induced constipation: Secondary | ICD-10-CM | POA: Diagnosis not present

## 2017-07-11 MED ORDER — MORPHINE SULFATE 15 MG PO TABS: 15.0000 mg | ORAL_TABLET | Freq: Two times a day (BID) | ORAL | 0 refills | Status: AC | PRN

## 2017-07-11 MED ORDER — MORPHINE SULFATE ER 30 MG PO TBCR: 30.0000 mg | EXTENDED_RELEASE_TABLET | Freq: Two times a day (BID) | ORAL | 0 refills | Status: AC

## 2017-07-11 NOTE — Progress Notes (Signed)
[facility]  PATIENT: Dale Parks DOB: 08-13-1941 REFERRING DOCTOR OR PCP:  Josiah Lobo SOURCE: Patient and records from [facility]  _________________________________   HISTORICAL  CHIEF COMPLAINT:  Chief Complaint  Patient presents with  . Back Pain    Sts. lbp (left side worse than right, and radiating down back of left leg to calf) is worse. More cramps left leg at night./fim    HISTORY OF PRESENT ILLNESS:  Dale Parks is a 76 year old man with low back pain and spasms in his back.    Update 07/11/2017: He reports left > right lower back and buttock pain.     He denies leg weakness.  He has no leg numbness.   He gets muscle cramps in the calves, helped by Flexeril.     He is sleeping well since starting Flexeril.    Once or twice a week, the pain wakes him up and he needs to get out of bed.     He continues on Morphine CR 30 mg and MSIR 15 mg twice a day (often just takes one).    He takes lorazepam 1 mg in the morning.   In the past, TPI's have helped a month or so but the last one only helped x 5 - 7 days.   When he had bursitis, the bursa injection helped tremendously. In the past, he had lumbar medial branch blocks with temporary benefit. However, radiofrequency ablation only helped for a couple weeks.  He stays active, walking a treadmill or track daily and doing water aerobics 3 x week.     ___________________________ From 02/15/2017:  He reports the lower back pain is increased.  Pain is mostly in the low lumbar spine and in the piriformis/SI region.   At times, pain radiates into the left leg.   He denies new weakness or numbness.   No change in bladder.   Pain is worse as he stands up.   Sitting reduces pain some.       He also notes spasms and takes cyclobenzaprine once or twice a day (mostly evening and night). The pain is helped by opiates.   He got the best benefit from morphine.   Nucynta was less beneficial and Fentanyl was  associated with lightheadedness and sweating.    His current dose is morphine CR 30 mg twice a day with 15 mg IR twice a day for breakthrough.     He is on lorazepam but only 1 mg in the morning most days - not at night.      Hip pain is doing better since the bursa injection. Neck and shoulders are not hurting him much.    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 There is facet joint hypertrophy and most of these levels and mild disc protrusions, as well. There is no definite nerve root compression. He underwent 2 medial branch blocks and radiofrequency ablation of the left L2, L3, L4 and L5 medial branch (dorsal ramus) nerves he had a good result with the medial branch blocks and good results for several weeks with the radiofrequency ablation but then pain returned to the same level as it had been in the past.    TPI's help x 2-4 weeks. 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   He notes less depression (had more last year he  feels).   He gets frustrated being abl to do less.   He is already on Cymbalta.   REVIEW OF SYSTEMS: Constitutional: No fevers, chills, sweats, or change in appetite Eyes: No visual changes, double vision, eye pain Ear, nose and throat: No hearing loss, ear pain, nasal congestion, sore throat Cardiovascular: No chest pain, palpitations Respiratory: No shortness of breath at rest or with exertion.   No wheezes.  Has snoring GastrointestinaI: No nausea, vomiting, diarrhea, abdominal pain, fecal incontinence Genitourinary: No dysuria, urinary retention or frequency.  No nocturia. Musculoskeletal: Notes right shoulder and lower back pain Integumentary: No rash, pruritus, skin lesions Neurological: as above Psychiatric: No depression at this time.  No anxiety Endocrine: No palpitations, diaphoresis, change in appetite,  change in weigh or increased thirst Hematologic/Lymphatic: He Bruises easily.   No anemia, purpura, petechiae. Allergic/Immunologic: No itchy/runny eyes, nasal congestion, recent allergic reactions, rashes  ALLERGIES: Allergies  Allergen Reactions  . Codeine Nausea Only  . Sulfa Antibiotics Rash  . Warfarin Sodium Rash  . Lidocaine Other (See Comments)    unknown    HOME MEDICATIONS:  Current Outpatient Prescriptions:  .  allopurinol (ZYLOPRIM) 100 MG tablet, Take 100 mg by mouth daily., Disp: , Rfl:  .  amLODipine (NORVASC) 5 MG tablet, Take 5 mg by mouth daily., Disp: , Rfl:  .  aspirin 81 MG tablet, Take 81 mg by mouth daily., Disp: , Rfl:  .  atorvastatin (LIPITOR) 80 MG tablet, Take 80 mg by mouth daily., Disp: , Rfl:  .  clopidogrel (PLAVIX) 75 MG tablet, Take 75 mg by mouth daily., Disp: , Rfl:  .  cyclobenzaprine (FLEXERIL) 5 MG tablet, Take 1 tablet (5 mg total) by mouth 3 (three) times daily., Disp: 90 tablet, Rfl: 11 .  fexofenadine (ALLEGRA) 60 MG tablet, Take 60 mg by mouth 2 (two) times daily., Disp: , Rfl:  .  LORazepam (ATIVAN) 1 MG tablet, Take 1 mg by mouth 2 (two) times daily as needed for anxiety., Disp: , Rfl:  .  metFORMIN (GLUCOPHAGE) 500 MG tablet, Take 500 mg by mouth 4 (four) times daily., Disp: , Rfl:  .  metoprolol succinate (TOPROL-XL) 100 MG 24 hr tablet, Take 100 mg by mouth daily. Take with or immediately following a meal., Disp: , Rfl:  .  morphine (MS CONTIN) 30 MG 12 hr tablet, Take 1 tablet (30 mg total) by mouth every 12 (twelve) hours., Disp: 60 tablet, Rfl: 0 .  morphine (MSIR) 15 MG tablet, Take 1 tablet (15 mg total) by mouth every 12 (twelve) hours as needed for severe pain., Disp: 60 tablet, Rfl: 0 .  nitroGLYCERIN (NITROSTAT) 0.4 MG SL tablet, Place 0.4 mg under the tongue every 5 (five) minutes as needed for chest pain., Disp: , Rfl:  .  Omega-3 Fatty Acids (FISH OIL) 1000 MG CAPS, Take 2,000 mg by mouth daily., Disp: , Rfl:  .   pantoprazole (PROTONIX) 40 MG tablet, Take 40 mg by mouth daily., Disp: , Rfl:  .  ramipril (ALTACE) 10 MG capsule, Take 10 mg by mouth daily., Disp: , Rfl:   PAST MEDICAL HISTORY: Past Medical History:  Diagnosis Date  . Coronary artery disease   . Diabetes mellitus without complication (HCC)   . Hypertension   . Vision abnormalities     PAST SURGICAL HISTORY: Past Surgical History:  Procedure Laterality Date  . CORONARY ANGIOPLASTY WITH STENT PLACEMENT    . KNEE ARTHROSCOPY Left   . ROTATOR CUFF REPAIR Right  FAMILY HISTORY: Family History  Problem Relation Age of Onset  . Stroke Mother   . COPD Father     SOCIAL HISTORY:  Social History   Social History  . Marital status: Married    Spouse name: N/A  . Number of children: N/A  . Years of education: N/A   Occupational History  . Not on file.   Social History Main Topics  . Smoking status: Former Smoker    Quit date: 01/20/2015  . Smokeless tobacco: Never Used  . Alcohol use No  . Drug use: No  . Sexual activity: Not on file   Other Topics Concern  . Not on file   Social History Narrative  . No narrative on file     PHYSICAL EXAM  Vitals:   07/11/17 1531  BP: 140/82  Pulse: 76  Resp: 14  Weight: 271 lb 8 oz (123.2 kg)  Height:  (1.981 m)    Body mass index is 31.37 kg/m.   General: The patient is well-developed and well-nourished and in no acute distress  Musculoskeletal:    There is tenderness in the left greater than right piriformis muscles and left greater than right lower lumbar paraspinal muscles. The trochanteric bursa is nontender per       Neurologic Exam  Mental status: The patient is alert and oriented x 3 at the time of the examination. The patient has apparent normal recent and remote memory, with an apparently normal attention span and concentration ability.   Speech is normal.  Cranial nerves: Extraocular movements are full.   Facial strength is normal.     Motor:   Muscle bulk is normal.   Tone is normal. Strength is  5 / 5 in legs   Sensory: Sensory testing is intact to touch in all 4 extremities. There is reduced vibration sensation in the toes.   Gait and station: Station is normal.   Gait is arthritic. Tandem gait is normal.    Reflexes: Deep tendon reflexes are 1+  Bilaterally in legs      DIAGNOSTIC DATA (LABS, IMAGING, TESTING) - I reviewed patient records, labs, notes, testing and imaging myself where available.  Records from [facility] were reviewed.   ASSESSMENT AND PLAN  Sciatica of left side  Facet syndrome, lumbar  Opiate use  Constipation due to opioid therapy   1.   We will Continue MS Contin 30 mg by mouth up to twice a day and MSIR 15 mg up to twice a day as needed.    He is also advised not to take more than the prescribed dose of opiate or lorazepam. 2.   Using sterile technique, the left piriformis muscle and left L4-L5 paraspinal muscles were injected with 80 mg Depo-Medrol in Marcaine. He tolerated the procedure well. 3.   Continue walking and aerobics.. 4.    In the past, he did not get a benefit from radiofrequency ablation but if pain worsens, consider referral for medial branch block/ablation. He will return to see me in 4 months or sooner if he has new or worsening neurologic symptoms.    Havana Baldwin A. Epimenio Foot, MD, PhD 07/11/2017, 4:24 PM Certified in Neurology, Clinical Neurophysiology, Sleep Medicine, Pain Medicine and Neuroimaging  [facility] [address] [place], [place] [address] [phone]

## 2017-10-19 ENCOUNTER — Telehealth: Payer: Self-pay | Admitting: *Deleted

## 2017-10-19 NOTE — Telephone Encounter (Signed)
PA for Morphine Sulfate ER 30mg  tabs #60/30 completed by phone with [employer], phone# [phone]. 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  Tried and failed meds: Morphine IR 15mg , Flexeril 5mg , Gabapentin 300mg , Medrol dose pk, Cymbalta 60mg , ASA, Tylenol. Allergic to Codeine and Lidocaine.  Info pended for pharmacist's review. Ref# [id number]/fim

## 2017-10-22 NOTE — Telephone Encounter (Signed)
Fax received from [employer] (phone# [phone]).  PA for  [id number] Sulfate ER 30mg  approved thru 10/01/18 under pt's Medicare part D benefit.  Ref# [id number]./fim

## 2017-10-31 NOTE — Telephone Encounter (Signed)
Pt called he gets RX from local pharmacy. FYI

## 2017-11-14 ENCOUNTER — Ambulatory Visit: Payer: Medicare Other | Admitting: Neurology

## 2018-12-26 ENCOUNTER — Telehealth: Payer: Self-pay | Admitting: Neurology

## 2018-12-26 NOTE — Telephone Encounter (Signed)
We can offer a virtual visit.

## 2018-12-26 NOTE — Telephone Encounter (Signed)
Pt is having problems with his back. He said it has flared up over the past few days. He has been taking care of his wife and has not been able to make an appt sooner. He is aware pt's are not being seen in the office but practicing telemedicine as much as possible. Please call to advise

## 2018-12-26 NOTE — Telephone Encounter (Signed)
I called pt back. Offered virtual appt tomorrow at930am. He does not have computer w/ camera or smart phone. Unable to do this. Declined telephone visit tomorrow d/t wife have procedure at 9am. Offered appt on Monday with Dr. Epimenio Foot.  He then asked if Dr. Epimenio Foot could just call in pain medications for him. Advised we cannot do this until he has a visit with Dr. Epimenio Foot. He has not been seen since 2018. He is going to contact PCP and see if they can prescribe something and then he will f/u with Dr. Epimenio Foot after. Advised him to call back if any further questions/concerns. He verbalized understanding and appreciation.

## 2018-12-26 NOTE — Telephone Encounter (Signed)
Dr. Epimenio Foot- I am not sure if we can offer a virtual visit? He has not been seen since 2018. What would you like to do?

## 2019-01-22 ENCOUNTER — Telehealth: Payer: Self-pay | Admitting: *Deleted

## 2019-01-22 NOTE — Telephone Encounter (Signed)
Pt returned Emma's call, I was skyped to take the call.   Pt's meds, allergies, and PMH were updated.  Pt denies any concerns about the VV next week.

## 2019-01-22 NOTE — Addendum Note (Signed)
Addended by: Geronimo Running A on: 01/22/2019 03:26 PM   Modules accepted: Orders

## 2019-01-22 NOTE — Telephone Encounter (Signed)
Called, LVM for pt to call office for [place] to update med list/pharamcy/allergies on file prior to VV next week with Dr. Epimenio Foot.

## 2019-01-28 ENCOUNTER — Other Ambulatory Visit: Payer: Self-pay

## 2019-01-28 ENCOUNTER — Ambulatory Visit (INDEPENDENT_AMBULATORY_CARE_PROVIDER_SITE_OTHER): Payer: Medicare Other | Admitting: Neurology

## 2019-01-28 ENCOUNTER — Encounter: Payer: Self-pay | Admitting: Neurology

## 2019-01-28 DIAGNOSIS — M549 Dorsalgia, unspecified: Secondary | ICD-10-CM

## 2019-01-28 DIAGNOSIS — M47816 Spondylosis without myelopathy or radiculopathy, lumbar region: Secondary | ICD-10-CM | POA: Diagnosis not present

## 2019-01-28 DIAGNOSIS — M5441 Lumbago with sciatica, right side: Secondary | ICD-10-CM | POA: Diagnosis not present

## 2019-01-28 DIAGNOSIS — G8929 Other chronic pain: Secondary | ICD-10-CM | POA: Diagnosis not present

## 2019-01-28 DIAGNOSIS — M5442 Lumbago with sciatica, left side: Secondary | ICD-10-CM

## 2019-01-28 MED ORDER — METHYLPREDNISOLONE 4 MG PO TABS: ORAL_TABLET | ORAL | 0 refills | Status: AC

## 2019-01-28 MED ORDER — GABAPENTIN 300 MG PO CAPS: 300.0000 mg | ORAL_CAPSULE | Freq: Three times a day (TID) | ORAL | 2 refills | Status: AC

## 2019-01-28 NOTE — Progress Notes (Signed)
Virtual Visit via Telephone Note I connected with Dale Parks on 01/28/19 at  3:00 PM EDT by telephone and verified that I am speaking with the correct person.  I discussed the limitations, risks, security and privacy concerns of performing an evaluation and management service by telephone and the availability of in person appointments. I also discussed with the patient that there may be a patient responsible charge related to this service. The patient expressed understanding and agreed to proceed.   History of Present Illness: He has a long history of back pain and I last saw him about 2 years ago.  In the past he was on morphine for the chronic pain but he has not been on chronic use of opiates for over a year.  He did get 1 single prescription of 20 hydrocodone tablets from Dr. Cyndia Diver recently.  His back started hurting a lot more about 2 months ago.   Pain radiates towards the knee on both sides.     He feels his legs are going to buckle on him but no definite chang in strength..   No change in gait.   Bladder is doing the same.   He denies any numbness.      From earlier notes: MRI of the lumbar spine from January 2012 shows degenerative disc and degenerative joint disease throughout the lumbar spine. There is facet joint hypertrophy and most of these levels and mild disc protrusions, as well. There is no definite nerve root compression. He underwent 2 medial branch blocks and radiofrequency ablation of the left L2, L3, L4 and L5 medial branch (dorsal ramus) nerves he had a good result with the medial branch blocks and good results for several weeks with the radiofrequency ablation but then pain returned to the same level as it had been in the past.    TPI's help x 2-4 weeks. The last trigger point injections were in January 2017 and he has tolerated them well    Observations/Objective: He is alert and oriented with fluent speech and good attention, knowledge and memory.  Assessment and  Plan: Facet syndrome, lumbar  Chronic bilateral low back pain with bilateral sciatica  1.   He has known lumbar degenerative changes and had not received much benefit from medial branch radiofrequency ablation many years ago.  In the past he was on chronic opiates but has not been more recently. 2.   I will call in a Medrol Dosepak and have him try gabapentin to see if that helps with the radiating pain. 3.   Our office does not do chronic pain management.  If pain persists, he would likely benefit from referral to a pain management center closer to his home and I gave him the name of several practices. 4.    No follow-up was scheduled at this time and he should follow-up with primary care or pain management if symptoms persist.   Follow Up Instructions: I discussed the assessment and treatment plan with the patient. The patient was provided an opportunity to ask questions and all were answered. The patient agreed with the plan and demonstrated an understanding of the instructions.  The patient was advised to call back or seek an in-person evaluation if the symptoms worsen or if the condition fails to improve as anticipated.  I provided 12 minutes of non-face-to-face time during this encounter.  Richard A. Epimenio Foot, MD, PhD, FAAN Certified in Neurology, Clinical Neurophysiology, Sleep Medicine, Pain Medicine and Neuroimaging Director, [facility]  at [facility]  [facility] [address] [place], [place] [address] [phone]
